# Patient Record
Sex: Female | Born: 1975
Health system: Southern US, Community
[De-identification: ages and names within clinical notes are randomized; demographics above are authoritative.]

## PROBLEM LIST (undated history)

## (undated) DIAGNOSIS — J302 Other seasonal allergic rhinitis: Secondary | ICD-10-CM

## (undated) DIAGNOSIS — Z87442 Personal history of urinary calculi: Secondary | ICD-10-CM

## (undated) HISTORY — DX: Other seasonal allergic rhinitis: J30.2

## (undated) HISTORY — PX: TUBAL LIGATION: SHX77

## (undated) HISTORY — DX: Personal history of urinary calculi: Z87.442

---

## 2015-02-17 ENCOUNTER — Encounter: Payer: Self-pay | Admitting: Obstetrics and Gynecology

## 2015-02-17 ENCOUNTER — Ambulatory Visit (INDEPENDENT_AMBULATORY_CARE_PROVIDER_SITE_OTHER): Payer: 59 | Admitting: Obstetrics and Gynecology

## 2015-02-17 VITALS — BP 110/60 | HR 84 | Resp 14 | Wt 119.0 lb

## 2015-02-17 DIAGNOSIS — N941 Dyspareunia: Secondary | ICD-10-CM

## 2015-02-17 DIAGNOSIS — Z01419 Encounter for gynecological examination (general) (routine) without abnormal findings: Secondary | ICD-10-CM

## 2015-02-17 DIAGNOSIS — Z0000 Encounter for general adult medical examination without abnormal findings: Secondary | ICD-10-CM

## 2015-02-17 DIAGNOSIS — IMO0002 Reserved for concepts with insufficient information to code with codable children: Secondary | ICD-10-CM

## 2015-02-17 DIAGNOSIS — N920 Excessive and frequent menstruation with regular cycle: Secondary | ICD-10-CM | POA: Diagnosis not present

## 2015-02-17 DIAGNOSIS — Z124 Encounter for screening for malignant neoplasm of cervix: Secondary | ICD-10-CM

## 2015-02-17 LAB — FERRITIN: Ferritin: 56 ng/mL (ref 10–291)

## 2015-02-17 LAB — CBC
HCT: 39.3 % (ref 36.0–46.0)
HEMOGLOBIN: 13.3 g/dL (ref 12.0–15.0)
MCH: 30.4 pg (ref 26.0–34.0)
MCHC: 33.8 g/dL (ref 30.0–36.0)
MCV: 89.9 fL (ref 78.0–100.0)
MPV: 9.7 fL (ref 8.6–12.4)
PLATELETS: 199 10*3/uL (ref 150–400)
RBC: 4.37 MIL/uL (ref 3.87–5.11)
RDW: 13.2 % (ref 11.5–15.5)
WBC: 9.7 10*3/uL (ref 4.0–10.5)

## 2015-02-17 LAB — COMPREHENSIVE METABOLIC PANEL
ALBUMIN: 4.3 g/dL (ref 3.6–5.1)
ALK PHOS: 55 U/L (ref 33–115)
ALT: 12 U/L (ref 6–29)
AST: 15 U/L (ref 10–30)
BILIRUBIN TOTAL: 0.6 mg/dL (ref 0.2–1.2)
BUN: 13 mg/dL (ref 7–25)
CALCIUM: 9 mg/dL (ref 8.6–10.2)
CO2: 24 mmol/L (ref 20–31)
Chloride: 104 mmol/L (ref 98–110)
Creat: 0.73 mg/dL (ref 0.50–1.10)
GLUCOSE: 76 mg/dL (ref 65–99)
POTASSIUM: 3.9 mmol/L (ref 3.5–5.3)
Sodium: 138 mmol/L (ref 135–146)
TOTAL PROTEIN: 6.6 g/dL (ref 6.1–8.1)

## 2015-02-17 LAB — LIPID PANEL
CHOLESTEROL: 169 mg/dL (ref 125–200)
HDL: 67 mg/dL (ref >=46)
LDL Cholesterol: 89 mg/dL (ref <130)
TRIGLYCERIDES: 66 mg/dL (ref <150)
Total CHOL/HDL Ratio: 2.5 Ratio (ref <=5.0)
VLDL: 13 mg/dL (ref <30)

## 2015-02-17 LAB — TSH: TSH: 1.16 u[IU]/mL (ref 0.350–4.500)

## 2015-02-17 NOTE — Progress Notes (Signed)
Patient ID: Sarah Chambers, female   DOB: Jan 19, 1976, 39 y.o.   MRN: [id number] 39 y.o. [id number] MarriedCaucasianF here for annual exam. Patient c/o cramping during intercourse. The pain after intercourse is intermittent. She can go a month without it and then can have it every time she has intercourse. This has been going on for years. Not cycle related. Cramping starts just after intercourse, with or without orgasm.  The pain is in her back, always severe, lasts for about 15 minutes, then resolves. Doesn't feel like she is having a spasm. More likely to occur if she is on top. Other than that no pain. Not sure if it happens without ejaculation. Often has a BM after the pain, then feels even better. Overall tolerable  During her cycle she can saturate a super tampon in up to 1 hour for up to 2 days.    Period Cycle (Days): 28 Period Duration (Days): 5-7 days  Period Pattern: Regular Menstrual Flow: Heavy Menstrual Control: Tampon, Maxi pad Dysmenorrhea: None  Patient's last menstrual period was 01/25/2015.          Sexually active: Yes.    The current method of family planning is tubal ligation.    Exercising: Yes.    cardio Smoker:  no  Health Maintenance: Pap:  Several years ago  History of abnormal Pap:  no MMG:  Never Colonoscopy:  Never BMD:   N/A TDaP:  Unsure, she will check her records Gardasil: N/A   reports that she has never smoked. She has never used smokeless tobacco. She reports that she drinks alcohol. She reports that she does not use illicit drugs.  History reviewed. No pertinent past medical history.  Past Surgical History  Procedure Laterality Date  . Cesarean section      1998, [id number]  . Tubal ligation      Current Outpatient Prescriptions  Medication Sig Dispense Refill  . cetirizine (ZYRTEC) 10 MG tablet Take 10 mg by mouth daily.     No current facility-administered medications for this visit.    History reviewed. No pertinent family  history.  Review of Systems  Constitutional: Negative.   HENT: Negative.   Eyes: Negative.   Respiratory: Negative.   Cardiovascular: Negative.   Gastrointestinal: Negative.   Endocrine: Negative.   Genitourinary: Positive for dyspareunia.  Musculoskeletal: Negative.   Skin: Negative.   Allergic/Immunologic: Negative.   Neurological: Negative.   Psychiatric/Behavioral: Negative.     Exam:   BP 110/60 mmHg  Pulse 84  Resp 14  Wt 119 lb (53.978 kg)  LMP 01/25/2015  Weight change: @WEIGHTCHANGE @ Height:      Ht Readings from Last 3 Encounters:  No data found for Ht    General appearance: alert, cooperative and appears stated age Head: Normocephalic, without obvious abnormality, atraumatic Neck: no adenopathy, supple, symmetrical, trachea midline and thyroid normal to inspection and palpation Lungs: clear to auscultation bilaterally Breasts: normal appearance, no masses or tenderness Heart: regular rate and rhythm Abdomen: soft, non-tender; bowel sounds normal; no masses,  no organomegaly Extremities: extremities normal, atraumatic, no cyanosis or edema Skin: Skin color, texture, turgor normal. No rashes or lesions Lymph nodes: Cervical, supraclavicular, and axillary nodes normal. No abnormal inguinal nodes palpated Neurologic: Grossly normal   Pelvic: External genitalia:  no lesions              Urethra:  normal appearing urethra with no masses, tenderness or lesions  Bartholins and Skenes: normal                 Vagina: normal appearing vagina with normal color and discharge, no lesions              Cervix: no lesions               Bimanual Exam:  Uterus:  Retroverted, not tender, normal sized, decreased mobility.              Adnexa: no mass, fullness, tenderness               Rectovaginal: Confirms               Anus:  normal sphincter tone, no lesions  Chaperone was present for exam.  A:  Well Woman with normal exam  Pain after  intercourse  Menorrhagia  P:   Pap with hpv  Mammogram  Discussed breast self exam  Discussed calcium and vit D  CBC, ferritin, TSH, Cholesterol/HDL, vit D  If she is anemic would recommend further evaluation  Discussed tracking when she has the pain after intercourse. Does it only happen with internal ejaculation and orgasm. Discussed trying condoms, avoiding positions that are move likely to cause the pain. Consider prophylacticly taking motrin. Track bowel movements with the  Pain. Discussed the option of ultrasound, with the intermittent nature and non-tender pelvic exam low yield. Discussed laparoscopy if pain became intolerable.

## 2015-02-17 NOTE — Patient Instructions (Signed)

## 2015-02-18 LAB — VITAMIN D 25 HYDROXY (VIT D DEFICIENCY, FRACTURES): VIT D 25 HYDROXY: 35 ng/mL (ref 30–100)

## 2015-02-19 LAB — IPS PAP TEST WITH HPV

## 2015-02-23 ENCOUNTER — Telehealth: Payer: Self-pay | Admitting: *Deleted

## 2015-02-23 NOTE — Telephone Encounter (Signed)
Return call

## 2015-02-23 NOTE — Telephone Encounter (Signed)
-----   Message from Romualdo Bolk, MD sent at 02/23/2015 10:47 AM EDT ----- Please advise the patient of normal results.

## 2015-02-23 NOTE — Telephone Encounter (Signed)
Spoke with patient and gave lab results -eh 

## 2015-02-23 NOTE — Telephone Encounter (Signed)
LMTC in regards to lab results -eh 

## 2015-02-25 ENCOUNTER — Encounter: Payer: Self-pay | Admitting: Primary Care

## 2015-02-25 ENCOUNTER — Ambulatory Visit (INDEPENDENT_AMBULATORY_CARE_PROVIDER_SITE_OTHER): Payer: 59 | Admitting: Primary Care

## 2015-02-25 VITALS — BP 112/64 | HR 75 | Temp 97.7°F | Ht 62.0 in | Wt 119.1 lb

## 2015-02-25 DIAGNOSIS — J302 Other seasonal allergic rhinitis: Secondary | ICD-10-CM | POA: Diagnosis not present

## 2015-02-25 MED ORDER — FLUTICASONE PROPIONATE 50 MCG/ACT NA SUSP: 2.0000 | Freq: Every day | NASAL | Status: DC

## 2015-02-25 NOTE — Progress Notes (Signed)
   Subjective:    Patient ID: Sarah Chambers, female    DOB: January 07, 1976, 39 y.o.   MRN: [id number]  HPI  Sarah Chambers is a 39 year old female who presents today to establish care and discuss the problems mentioned below. She was recently evaluated at [facility] for annual exam, will review records. She had CBC, CMP, TSH, Lipid Panel, Vitamin D, and Pap. All tests were normal.   1) Kidney Stone: History of 3 separate incidences, last episode 4 years ago.   2) Seasonal Allergies: Present for years. Symptoms are worse in Fall and Spring. She will get postnasal drip and nasal congestion. She will take Zyrtec OTC daily. She was once managed on Fluticasone but has not had recently.  Review of Systems  Constitutional: Negative for unexpected weight change.  HENT: Positive for congestion and postnasal drip. Negative for sinus pressure.   Respiratory: Positive for cough. Negative for shortness of breath.   Cardiovascular: Negative for chest pain.  Gastrointestinal: Negative for diarrhea and constipation.  Genitourinary: Negative for difficulty urinating.       Regular periods  Musculoskeletal: Negative for myalgias and arthralgias.  Skin: Negative for rash.  Neurological: Negative for dizziness, numbness and headaches.  Psychiatric/Behavioral:       Denies concerns for anxiety or depression       Past Medical History  Diagnosis Date  . Seasonal allergies   . History of kidney stones     Social History   Social History  . Marital Status: Married    Spouse Name: N/A  . Number of Children: N/A  . Years of Education: N/A   Occupational History  . Not on file.   Social History Main Topics  . Smoking status: Never Smoker   . Smokeless tobacco: Not on file  . Alcohol Use: No  . Drug Use: Not on file  . Sexual Activity: Not on file   Other Topics Concern  . Not on file   Social History Narrative   Married.   3 children.   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   Highest level of education [employer].   Enjoys exercising, reading     Past Surgical History  Procedure Laterality Date  . Cesarean section  1998, 2000, 2007    No family history on file.  Allergies  Allergen Reactions  . Amoxicillin Hives    No current outpatient prescriptions on file prior to visit.   No current facility-administered medications on file prior to visit.    BP 112/64 mmHg  Pulse 75  Temp(Src) 97.7 F (36.5 C) (Oral)  Ht  (1.575 m)  Wt 119 lb 1.9 oz (54.032 kg)  BMI 21.78 kg/m2  SpO2 99%  LMP 02/23/2015    Objective:   Physical Exam  Constitutional: She is oriented to person, place, and time. She appears well-nourished.  Cardiovascular: Normal rate and regular rhythm.   Pulmonary/Chest: Effort normal and breath sounds normal.  Neurological: She is alert and oriented to person, place, and time.  Skin: Skin is warm and dry.  Psychiatric: She has a normal mood and affect.          Assessment & Plan:

## 2015-02-25 NOTE — Assessment & Plan Note (Signed)
Present for years. Symptoms of postnasal drip and nasal congestion, worse during seasonal changes. Taking daily zyrtec, uses flonase PRN.

## 2015-02-25 NOTE — Progress Notes (Signed)
Pre visit review using our clinic review tool, if applicable. No additional management support is needed unless otherwise documented below in the visit note. 

## 2015-02-25 NOTE — Patient Instructions (Signed)
Start taking Vitamin D 1000 units daily. This may be purchased over the counter.  It was a pleasure to meet you today! Please don't hesitate to call me with any questions. Welcome to [facility]!

## 2015-03-05 ENCOUNTER — Encounter: Payer: Self-pay | Admitting: Primary Care

## 2015-04-16 ENCOUNTER — Ambulatory Visit (INDEPENDENT_AMBULATORY_CARE_PROVIDER_SITE_OTHER): Payer: 59 | Admitting: Family Medicine

## 2015-04-16 ENCOUNTER — Encounter: Payer: Self-pay | Admitting: Family Medicine

## 2015-04-16 VITALS — BP 94/60 | HR 89 | Temp 98.3°F | Ht 62.0 in | Wt 120.5 lb

## 2015-04-16 DIAGNOSIS — J209 Acute bronchitis, unspecified: Secondary | ICD-10-CM | POA: Diagnosis not present

## 2015-04-16 DIAGNOSIS — J302 Other seasonal allergic rhinitis: Secondary | ICD-10-CM

## 2015-04-16 MED ORDER — HYDROCODONE-CHLORPHENIRAMINE 5-4 MG/5ML PO SOLN: ORAL | Status: DC

## 2015-04-16 MED ORDER — AZITHROMYCIN 250 MG PO TABS: ORAL_TABLET | ORAL | Status: DC

## 2015-04-16 NOTE — Assessment & Plan Note (Signed)
Ongoing x 3 weeks.. Concerning for bacterial infection.  MAy also be ue to allergies. IF not improving as expected consider trial of singulair.

## 2015-04-16 NOTE — Progress Notes (Signed)
Pre visit review using our clinic review tool, if applicable. No additional management support is needed unless otherwise documented below in the visit note. 

## 2015-04-16 NOTE — Progress Notes (Signed)
   Subjective:    Patient ID: Sarah Chambers, female    DOB: [birth year], 39 y.o.   MRN: [id number]  Cough This is a new problem. The current episode started 1 to 4 weeks ago (3 weeks). The problem has been waxing and waning. The cough is productive of sputum. Associated symptoms include ear congestion, headaches, nasal congestion, postnasal drip and wheezing. Pertinent negatives include no ear pain, fever, myalgias or shortness of breath. Associated symptoms comments: Sinus pressure. The symptoms are aggravated by lying down. Risk factors: non smoker. She has tried prescription cough suppressant (zyrtec, mucinex D off and on for 2-3 weeks.) for the symptoms. The treatment provided no relief. Her past medical history is significant for environmental allergies. There is no history of asthma, COPD or emphysema. spring and fall allergies   Also using flonase. Needs refill of cough suppressant from last year.  Lots of sick contacts.  Social History /Family History/Past Medical History reviewed and updated if needed.   Review of Systems  Constitutional: Negative for fever.  HENT: Positive for postnasal drip. Negative for ear pain.   Respiratory: Positive for cough and wheezing. Negative for shortness of breath.   Musculoskeletal: Negative for myalgias.  Allergic/Immunologic: Positive for environmental allergies.  Neurological: Positive for headaches.       Objective:   Physical Exam  Constitutional: Vital signs are normal. She appears well-developed and well-nourished. She is cooperative.  Non-toxic appearance. She does not appear ill. No distress.  HENT:  Head: Normocephalic.  Right Ear: Hearing, tympanic membrane, external ear and ear canal normal. Tympanic membrane is not erythematous, not retracted and not bulging.  Left Ear: Hearing, tympanic membrane, external ear and ear canal normal. Tympanic membrane is not erythematous, not retracted and not bulging.  Nose: Mucosal edema and  rhinorrhea present. Right sinus exhibits no maxillary sinus tenderness and no frontal sinus tenderness. Left sinus exhibits no maxillary sinus tenderness and no frontal sinus tenderness.  Mouth/Throat: Uvula is midline, oropharynx is clear and moist and mucous membranes are normal.  Eyes: Conjunctivae, EOM and lids are normal. Pupils are equal, round, and reactive to light. Lids are everted and swept, no foreign bodies found.  Neck: Trachea normal and normal range of motion. Neck supple. Carotid bruit is not present. No thyroid mass and no thyromegaly present.  Cardiovascular: Normal rate, regular rhythm, S1 normal, S2 normal, normal heart sounds, intact distal pulses and normal pulses.  Exam reveals no gallop and no friction rub.   No murmur heard. Pulmonary/Chest: Effort normal and breath sounds normal. No tachypnea. No respiratory distress. She has no decreased breath sounds. She has no wheezes. She has no rhonchi. She has no rales.  Neurological: She is alert.  Skin: Skin is warm, dry and intact. No rash noted.  Psychiatric: Her speech is normal and behavior is normal. Judgment normal. Her mood appears not anxious. Cognition and memory are normal. She does not exhibit a depressed mood.          Assessment & Plan:

## 2015-04-16 NOTE — Patient Instructions (Addendum)
Continue mucinex D and zyrtec daily.  Start nasal saline spray 2-3 times daily.  Complete course of  Azithromycin.  Call if not improving as expected to reconsider allergies as cause for a trial of Singulair.  Go to ER if shortness of breath.

## 2015-08-24 ENCOUNTER — Encounter: Payer: Self-pay | Admitting: Primary Care

## 2015-08-24 ENCOUNTER — Ambulatory Visit (INDEPENDENT_AMBULATORY_CARE_PROVIDER_SITE_OTHER): Payer: 59 | Admitting: Primary Care

## 2015-08-24 VITALS — BP 116/70 | HR 85 | Temp 98.0°F | Ht 62.0 in | Wt 118.1 lb

## 2015-08-24 DIAGNOSIS — J101 Influenza due to other identified influenza virus with other respiratory manifestations: Secondary | ICD-10-CM

## 2015-08-24 LAB — POC INFLUENZA A&B (BINAX/QUICKVUE)
Influenza A, POC: NEGATIVE
Influenza B, POC: POSITIVE — AB

## 2015-08-24 MED ORDER — HYDROCODONE-HOMATROPINE 5-1.5 MG/5ML PO SYRP: 5.0000 mL | ORAL_SOLUTION | Freq: Every evening | ORAL | Status: DC | PRN

## 2015-08-24 MED ORDER — OSELTAMIVIR PHOSPHATE 75 MG PO CAPS: 75.0000 mg | ORAL_CAPSULE | Freq: Two times a day (BID) | ORAL | Status: DC

## 2015-08-24 MED ORDER — BENZONATATE 200 MG PO CAPS: 200.0000 mg | ORAL_CAPSULE | Freq: Three times a day (TID) | ORAL | Status: DC | PRN

## 2015-08-24 NOTE — Progress Notes (Signed)
Subjective:    Patient ID: Sarah Chambers, female    DOB: 11-Feb-1976, 40 y.o.   MRN: [id number]  HPI  Ms. Loretha StaplerVanNess is a 40 year old female who presents today with a chief complaint of flu like symptoms. She reports fevers, nasal congestion, chills, body aches, cough, headache, ear pressure. Her symptoms began with a cough on Saturday last week, woke up "Sunday morning with the remaining symptoms. She's tried taken Mucinex, ibuprofen, Flonase, saline nasal spray without improvement. Her cough is mostly dry. 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  Review of Systems  Constitutional: Positive for fever and chills.  HENT: Positive for congestion.        Ear fullness  Respiratory: Positive for cough.   Musculoskeletal: Positive for myalgias.  Neurological: Positive for headaches.       Past Medical History  Diagnosis Date  . Seasonal allergies   . History of kidney stones     Social History   Social History  . Marital Status: Married    Spouse Name: N/A  . Number of Children: N/A  . Years of Education: N/A   Occupational History  . Not on file.   Social History Main Topics  . Smoking status: Never Smoker   . Smokeless tobacco: Never Used  . Alcohol Use: No     Comment: socailly   . Drug Use: No  . Sexual Activity:    Partners: Male   Other Topics Concern  . Not on file   Social History Narrative   ** Merged History Encounter **       Married. 3 children. 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 Highest level of education [employer]. Enjoys exercising, reading     Past Surgical History  Procedure Laterality Date  . Cesarean section      19" 98, 2000,2007  . Tubal ligation    . Cesarean section  1998, 2000, 2007    No family history on file.  Allergies  Allergen Reactions  . Amoxicillin Hives    Current Outpatient Prescriptions on File Prior to Visit  Medication Sig Dispense Refill  . cetirizine (ZYRTEC)  10 MG tablet Take 10 mg by mouth daily.    . fluticasone (FLONASE) 50 MCG/ACT nasal spray Place 2 sprays into both nostrils daily. 16 g 5   No current facility-administered medications on file prior to visit.    BP 116/70 mmHg  Pulse 85  Temp(Src) 98 F (36.7 C) (Oral)  Ht 5\' 2"  (1.575 m)  Wt 118 lb 1.9 oz (53.579 kg)  BMI 21.60 kg/m2  SpO2 99%  LMP 08/02/2015    Objective:   Physical Exam  Constitutional: She appears well-nourished. She appears ill.  HENT:  Right Ear: Tympanic membrane and ear canal normal.  Left Ear: Tympanic membrane and ear canal normal.  Nose: Right sinus exhibits no maxillary sinus tenderness and no frontal sinus tenderness. Left sinus exhibits no maxillary sinus tenderness and no frontal sinus tenderness.  Mouth/Throat: Posterior oropharyngeal erythema present. No oropharyngeal exudate or posterior oropharyngeal edema.  Eyes: Conjunctivae are normal.  Neck: Neck supple.  Cardiovascular: Regular rhythm.   Sinus tachycardia  Pulmonary/Chest: Effort normal. She has no decreased breath sounds. She has wheezes in the left lower field. She has no rhonchi.  Lymphadenopathy:    She has cervical adenopathy.  Skin: Skin is warm and dry.          Assessment &  Plan:  Flu-like Symptoms:  Sudden onset on Sunday with fevers, chills, body aches, fatigue. Cough began Saturday. Exam with clear lungs, sinus tachycardia, appears ill and fatigued. Rapid Flu: Positive for influenza B. Start Tamilfu as she is barley with in the 48 hour window. RX for Tessalon pearls for day cough, Hycodan for night cough. Continue Tylenol/ibuprofen for fevers and body aches. Increase fluids and rest.

## 2015-08-24 NOTE — Addendum Note (Signed)
Addended by: Tawnya CrookSAMBATH, Rickayla Wieland on: 08/24/2015 04:03 PM   Modules accepted: Orders

## 2015-08-24 NOTE — Patient Instructions (Signed)
You may take Benzonatate capsules for cough. Take 1 capsule by mouth three times daily as needed for cough.  You may take the Hycodan cough suppressant at bedtime as needed for cough and rest. Caution this medication contains codeine and will make you feel drowsy.  Start Tamiflu to help shorten the course of the flu and for symptom improvement. Take 1 capsule by mouth twice daily for 5 days.   Increase consumption of fluids and rest.  It was a pleasure to see you today!  Influenza, Adult Influenza ("the flu") is a viral infection of the respiratory tract. It occurs more often in winter months because people spend more time in close contact with one another. Influenza can make you feel very sick. Influenza easily spreads from person to person (contagious). CAUSES  Influenza is caused by a virus that infects the respiratory tract. You can catch the virus by breathing in droplets from an infected person's cough or sneeze. You can also catch the virus by touching something that was recently contaminated with the virus and then touching your mouth, nose, or eyes. RISKS AND COMPLICATIONS You may be at risk for a more severe case of influenza if you smoke cigarettes, have diabetes, have chronic heart disease (such as heart failure) or lung disease (such as asthma), or if you have a weakened immune system. Elderly people and pregnant women are also at risk for more serious infections. The most common problem of influenza is a lung infection (pneumonia). Sometimes, this problem can require emergency medical care and may be life threatening. SIGNS AND SYMPTOMS  Symptoms typically last 4 to 10 days and may include:  Fever.  Chills.  Headache, body aches, and muscle aches.  Sore throat.  Chest discomfort and cough.  Poor appetite.  Weakness or feeling tired.  Dizziness.  Nausea or vomiting. DIAGNOSIS  Diagnosis of influenza is often made based on your history and a physical exam. A nose or  throat swab test can be done to confirm the diagnosis. TREATMENT  In mild cases, influenza goes away on its own. Treatment is directed at relieving symptoms. For more severe cases, your health care provider may prescribe antiviral medicines to shorten the sickness. Antibiotic medicines are not effective because the infection is caused by a virus, not by bacteria. HOME CARE INSTRUCTIONS  Take medicines only as directed by your health care provider.  Use a cool mist humidifier to make breathing easier.  Get plenty of rest until your temperature returns to normal. This usually takes 3 to 4 days.  Drink enough fluid to keep your urine clear or pale yellow.  Cover yourmouth and nosewhen coughing or sneezing,and wash your handswellto prevent thevirusfrom spreading.  Stay homefromwork orschool untilthe fever is gonefor at least 861full day. PREVENTION  An annual influenza vaccination (flu shot) is the best way to avoid getting influenza. An annual flu shot is now routinely recommended for all adults in the [place] SEEK MEDICAL CARE IF:  You experiencechest pain, yourcough worsens,or you producemore mucus.  Youhave nausea,vomiting, ordiarrhea.  Your fever returns or gets worse. SEEK IMMEDIATE MEDICAL CARE IF:  You havetrouble breathing, you become short of breath,or your skin ornails becomebluish.  You have severe painor stiffnessin the neck.  You develop a sudden headache, or pain in the face or ear.  You have nausea or vomiting that you cannot control. MAKE SURE YOU:   Understand these instructions.  Will watch your condition.  Will get help right away if you are not  doing well or get worse.   This information is not intended to replace advice given to you by your health care provider. Make sure you discuss any questions you have with your health care provider.   Document Released: 05/19/2000 Document Revised: 06/12/2014 Document Reviewed:  08/21/2011 Elsevier Interactive Patient Education [employer]

## 2015-08-24 NOTE — Progress Notes (Signed)
Pre visit review using our clinic review tool, if applicable. No additional management support is needed unless otherwise documented below in the visit note. 

## 2015-09-23 ENCOUNTER — Encounter: Payer: Self-pay | Admitting: Family Medicine

## 2015-09-23 ENCOUNTER — Ambulatory Visit (INDEPENDENT_AMBULATORY_CARE_PROVIDER_SITE_OTHER): Payer: 59 | Admitting: Family Medicine

## 2015-09-23 VITALS — BP 104/62 | HR 82 | Temp 98.1°F | Wt 118.2 lb

## 2015-09-23 DIAGNOSIS — J209 Acute bronchitis, unspecified: Secondary | ICD-10-CM

## 2015-09-23 DIAGNOSIS — R05 Cough: Secondary | ICD-10-CM | POA: Insufficient documentation

## 2015-09-23 DIAGNOSIS — R051 Acute cough: Secondary | ICD-10-CM | POA: Insufficient documentation

## 2015-09-23 DIAGNOSIS — R059 Cough, unspecified: Secondary | ICD-10-CM | POA: Insufficient documentation

## 2015-09-23 MED ORDER — HYDROCOD POLST-CPM POLST ER 10-8 MG/5ML PO SUER: 5.0000 mL | Freq: Two times a day (BID) | ORAL | Status: DC | PRN

## 2015-09-23 MED ORDER — PREDNISONE 50 MG PO TABS: ORAL_TABLET | ORAL | Status: DC

## 2015-09-23 MED ORDER — DOXYCYCLINE HYCLATE 100 MG PO TABS: 100.0000 mg | ORAL_TABLET | Freq: Two times a day (BID) | ORAL | Status: DC

## 2015-09-23 NOTE — Progress Notes (Signed)
Subjective:  Patient ID: Sarah Chambers, female    DOB: 13-Jan-1976  Age: 40 y.o. MRN: [id number]  CC: Cough  HPI:  40 year old female presents to clinic today for an acute visit with complaints of cough.  Patient states that she has had a severe cough for the past month. She states that it started after she was diagnosed with influenza. Cough is severe and unrelenting. She's been taking over-the-counter antihistamine as well as Flonase with no improvement. She reports that she's had some wheezing. She used her son's albuterol inhaler with no improvement. Additionally she reports associated congestion. She states her symptoms are severe and she would like some relief. No associated fevers or chills. No known exacerbating factors.  Social Hx   Social History   Social History  . Marital Status: Married    Spouse Name: N/A  . Number of Children: N/A  . Years of Education: N/A   Social History Main Topics  . Smoking status: Never Smoker   . Smokeless tobacco: Never Used  . Alcohol Use: No     Comment: socailly   . Drug Use: No  . Sexual Activity:    Partners: Male   Other Topics Concern  . None   Social History Narrative   ** Merged History Encounter **       Married. 3 children. 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 Enjoys exercising, reading    Review of Systems  Constitutional: Negative for fever.  HENT: Positive for congestion.   Respiratory: Positive for cough and wheezing.    Objective:  BP 104/62 mmHg  Pulse 82  Temp(Src) 98.1 F (36.7 C) (Oral)  Wt 118 lb 4 oz (53.638 kg)  SpO2 98%  LMP 08/02/2015  BP/Weight 09/23/2015 08/24/2015 04/16/2015  Systolic BP 104 116 94  Diastolic BP 62 70 60  Wt. (Lbs) 118.25 118.12 120.5  BMI 21.62 21.6 22.03   Physical Exam  Constitutional: She is oriented to person, place, and time. She appears well-developed. No distress.  HENT:  Mouth/Throat: Oropharynx is  clear and moist.  Cardiovascular: Normal rate and regular rhythm.   Pulmonary/Chest: Effort normal.  Coarse breath sounds throughout. No wheezing noted.  Neurological: She is alert and oriented to person, place, and time.  Psychiatric: She has a normal mood and affect.  Vitals reviewed.  Lab Results  Component Value Date   WBC 9.7 02/17/2015   HGB 13.3 02/17/2015   HCT 39.3 02/17/2015   PLT 199 02/17/2015   GLUCOSE 76 02/17/2015   CHOL 169 02/17/2015   TRIG 66 02/17/2015   HDL 67 02/17/2015   LDLCALC 89 02/17/2015   ALT 12 02/17/2015   AST 15 02/17/2015   NA 138 02/17/2015   K 3.9 02/17/2015   CL 104 02/17/2015   CREATININE 0.73 02/17/2015   BUN 13 02/17/2015   CO2 24 02/17/2015   TSH 1.160 02/17/2015   Assessment & Plan:   Problem List Items Addressed This Visit    Acute bronchitis - Primary    No acute problem. History consistent with acute bronchitis. Patient was concerned prednisone and Tussionex. Given duration of illness, obtaining chest x-ray and starting patient on empiric doxycycline.      Relevant Orders   DG Chest 2 View      Meds ordered this encounter  Medications  . chlorpheniramine-HYDROcodone (TUSSIONEX PENNKINETIC ER) 10-8 MG/5ML SUER    Sig: Take 5 mLs by mouth every 12 (twelve)  hours as needed.    Dispense:  115 mL    Refill:  0  . doxycycline (VIBRA-TABS) 100 MG tablet    Sig: Take 1 tablet (100 mg total) by mouth 2 (two) times daily.    Dispense:  14 tablet    Refill:  0  . predniSONE (DELTASONE) 50 MG tablet    Sig: 1 tablet daily x 5 days.    Dispense:  5 tablet    Refill:  0    Follow-up: PRN  Everlene OtherJayce Teyonna Plaisted DO [facility] [place]

## 2015-09-23 NOTE — Patient Instructions (Signed)

## 2015-09-23 NOTE — Assessment & Plan Note (Signed)
No acute problem. History consistent with acute bronchitis. Patient was concerned prednisone and Tussionex. Given duration of illness, obtaining chest x-ray and starting patient on empiric doxycycline.

## 2015-09-27 ENCOUNTER — Ambulatory Visit (HOSPITAL_COMMUNITY)
Admission: RE | Admit: 2015-09-27 | Discharge: 2015-09-27 | Disposition: A | Discharge status: Home / Self Care | Payer: 59 | Source: Ambulatory Visit | Attending: Family Medicine | Admitting: Family Medicine

## 2015-09-27 DIAGNOSIS — R05 Cough: Secondary | ICD-10-CM | POA: Diagnosis not present

## 2015-09-27 DIAGNOSIS — J209 Acute bronchitis, unspecified: Secondary | ICD-10-CM | POA: Diagnosis not present

## 2015-09-27 DIAGNOSIS — R0602 Shortness of breath: Secondary | ICD-10-CM | POA: Diagnosis not present

## 2015-11-08 ENCOUNTER — Encounter: Payer: Self-pay | Admitting: Nurse Practitioner

## 2015-11-08 ENCOUNTER — Ambulatory Visit (INDEPENDENT_AMBULATORY_CARE_PROVIDER_SITE_OTHER): Payer: 59 | Admitting: Nurse Practitioner

## 2015-11-08 VITALS — BP 102/60 | HR 72 | Resp 13 | Wt 119.0 lb

## 2015-11-08 DIAGNOSIS — N760 Acute vaginitis: Secondary | ICD-10-CM | POA: Diagnosis not present

## 2015-11-08 NOTE — Patient Instructions (Signed)

## 2015-11-08 NOTE — Progress Notes (Signed)
40 y.o. Married Caucasian female [id number] here with complaint of vaginal symptoms of odor, burning, and increase discharge. Describes discharge as white thin brown. Onset of symptoms 2 weeks days ago. Denies new personal products or vaginal dryness. No STD concerns. Urinary symptoms none.  LMP 10/25/15. Contraception is BTL.   O:  Healthy female WDWN Affect: normal, orientation x 3  Exam: Abdomen: soft and non tender Lymph node: no enlargement or tenderness Pelvic exam: External genital: normal female BUS: negative Vagina: brown thin discharge noted.  Affirm taken. Retained tampon is removed Cervix: normal, non tender, no CMT   A: Vaginitis - R/O BV   P: Discussed findings of vaginitis and etiology. Discussed Aveeno or baking soda sitz bath for comfort. Avoid moist clothes or pads for extended period of time. If working out in gym clothes or swim suits for long periods of time change underwear or bottoms of swimsuit if possible. Olive Oil/Coconut Oil use for skin protection prior to activity can be used to external skin.  Rx: Do not treat with Metrogel cream - use Flagyl PO if BV  Follow with Affirm  RV prn

## 2015-11-09 LAB — WET PREP BY MOLECULAR PROBE
Candida species: NEGATIVE
GARDNERELLA VAGINALIS: NEGATIVE
Trichomonas vaginosis: NEGATIVE

## 2015-11-10 NOTE — Progress Notes (Signed)
Encounter reviewed Yesmin Mutch, MD   

## 2015-12-29 MED FILL — FLUTICASONE PROP 50 MCG SPR: 50 | 30 days supply | Qty: 16 | Fill #1

## 2016-03-30 MED FILL — CEFUROXIME AXETIL 250 MG TA: 250 | 10 days supply | Qty: 40 | Fill #0

## 2016-05-09 ENCOUNTER — Encounter: Payer: Self-pay | Admitting: Primary Care

## 2016-05-09 ENCOUNTER — Ambulatory Visit (INDEPENDENT_AMBULATORY_CARE_PROVIDER_SITE_OTHER): Payer: 59 | Admitting: Primary Care

## 2016-05-09 ENCOUNTER — Ambulatory Visit (INDEPENDENT_AMBULATORY_CARE_PROVIDER_SITE_OTHER)
Admission: RE | Admit: 2016-05-09 | Discharge: 2016-05-09 | Disposition: A | Discharge status: Home / Self Care | Payer: 59 | Source: Ambulatory Visit | Attending: Primary Care | Admitting: Primary Care

## 2016-05-09 VITALS — BP 130/84 | HR 78 | Temp 98.2°F | Ht 62.0 in | Wt 121.4 lb

## 2016-05-09 DIAGNOSIS — R05 Cough: Secondary | ICD-10-CM

## 2016-05-09 DIAGNOSIS — R059 Cough, unspecified: Secondary | ICD-10-CM

## 2016-05-09 MED ORDER — DOXYCYCLINE HYCLATE 100 MG PO TABS: 100.0000 mg | ORAL_TABLET | Freq: Two times a day (BID) | ORAL | 0 refills | Status: DC

## 2016-05-09 NOTE — Progress Notes (Signed)
Subjective:    Patient ID: Kathaleen BuryGinger D Bretado, female    DOB: [birth year], 40 y.o.   MRN: [id number]  HPI  Ms. Loretha StaplerVanNess is a 40 year old female who presents today with a chief complaint of cough and ear pain. Her pain is located to the left ear. She also reports headache, sore throat, chest congestion, night sweats. Her symptoms have been present for the past 1 month.  She was treated was treated through an e-visit for a "bacterial infection" and was treated with a 10 day course of antibiotics (thinks it may have been cephalexin) with temporary improvement to her symptoms. Intermittently since then she's noticed left ear pain, increased cough, and congestion. She's taken Mucinex daily, Flonase, and Zyrtec without any improvement. She started feeling worse several days ago.  Review of Systems  Constitutional: Positive for diaphoresis and fatigue. Negative for chills and fever.  HENT: Positive for congestion, ear pain, postnasal drip and sinus pressure.   Respiratory: Positive for cough. Negative for shortness of breath.        Past Medical History:  Diagnosis Date  . History of kidney stones   . Seasonal allergies      Social History   Social History  . Marital status: Married    Spouse name: N/A  . Number of children: N/A  . Years of education: N/A   Occupational History  . Not on file.   Social History Main Topics  . Smoking status: Never Smoker  . Smokeless tobacco: Never Used  . Alcohol use No     Comment: socailly   . Drug use: No  . Sexual activity: Yes    Partners: Male   Other Topics Concern  . Not on file   Social History Narrative   ** Merged History Encounter **       Married. 3 children. 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 Highest level of education [employer]. Enjoys exercising, reading     Past Surgical History:  Procedure Laterality Date  . CESAREAN SECTION     1998, [id number],2007  . CESAREAN SECTION  1998, 2000, 2007  . TUBAL  LIGATION      No family history on file.  Allergies  Allergen Reactions  . Amoxicillin Hives    Current Outpatient Prescriptions on File Prior to Visit  Medication Sig Dispense Refill  . cetirizine (ZYRTEC) 10 MG tablet Take 10 mg by mouth daily.     No current facility-administered medications on file prior to visit.     BP 130/84   Pulse 78   Temp 98.2 F (36.8 C) (Oral)   Ht 5\' 2"  (1.575 m)   Wt 121 lb 6.4 oz (55.1 kg)   LMP 04/18/2016   SpO2 99%   BMI 22.20 kg/m    Objective:   Physical Exam  Constitutional: She appears well-nourished. She does not appear ill.  HENT:  Right Ear: Tympanic membrane and ear canal normal.  Left Ear: Tympanic membrane and ear canal normal.  Nose: Right sinus exhibits no maxillary sinus tenderness and no frontal sinus tenderness. Left sinus exhibits no maxillary sinus tenderness and no frontal sinus tenderness.  Mouth/Throat: Oropharynx is clear and moist.  Eyes: Conjunctivae are normal.  Neck: Neck supple.  Cardiovascular: Normal rate and regular rhythm.   Pulmonary/Chest: Effort normal. She has no decreased breath sounds. She has no wheezes. She has rhonchi in the right lower field and the left lower field. She has no rales.  Lymphadenopathy:    She has no cervical adenopathy.  Skin: Skin is warm and dry.          Assessment & Plan:  Acute Bronchitis:  Cough, congestion, ear pain, fatigue x 1 month. Temporary improvement with Cephalexin from e-visit, now worse. Exam today with moderate rhonchi to bases, does appear ill. Given duration of symptoms, examination, starting to feel worse, will cover for atypical bacterial involvement. Rx for Doxycycline sent to pharmacy. Continue albuterol inhaler, Mucinex, Zyrtec. Fluids, rest, follow up PRN.  Morrie Sheldonlark,Creston Klas Kendal, NP

## 2016-05-09 NOTE — Progress Notes (Signed)
nasalPre visit review using our clinic review tool, if applicable. No additional management support is needed unless otherwise documented below in the visit note.

## 2016-05-09 NOTE — Patient Instructions (Signed)
Start Doxycycline antibiotic. Take 1 tablet by mouth twice daily for 10 days.  Complete xray(s) prior to leaving today. I will notify you of your results once received.  Continue Mucinex and Zyrtec. Try taking either Delsym or Robitussin for cough.  Ensure you are staying hydrated and rest.  It was a pleasure to see you today!

## 2016-05-10 ENCOUNTER — Other Ambulatory Visit: Payer: Self-pay | Admitting: *Deleted

## 2016-05-10 ENCOUNTER — Other Ambulatory Visit: Payer: Self-pay | Admitting: Primary Care

## 2016-05-10 DIAGNOSIS — J45909 Unspecified asthma, uncomplicated: Secondary | ICD-10-CM

## 2016-05-10 MED ORDER — PREDNISONE 20 MG PO TABS: ORAL_TABLET | ORAL | 0 refills | Status: DC

## 2016-08-09 ENCOUNTER — Encounter: Payer: Self-pay | Admitting: Adult Health

## 2016-08-09 ENCOUNTER — Ambulatory Visit (INDEPENDENT_AMBULATORY_CARE_PROVIDER_SITE_OTHER): Payer: 59 | Admitting: Adult Health

## 2016-08-09 VITALS — BP 102/68 | HR 78 | Temp 98.2°F | Wt 123.6 lb

## 2016-08-09 DIAGNOSIS — R109 Unspecified abdominal pain: Secondary | ICD-10-CM

## 2016-08-09 LAB — POC URINALSYSI DIPSTICK (AUTOMATED)
BILIRUBIN UA: NEGATIVE
Blood, UA: NEGATIVE
GLUCOSE UA: NEGATIVE
KETONES UA: NEGATIVE
Nitrite, UA: NEGATIVE
PROTEIN UA: NEGATIVE
Spec Grav, UA: 1.015
Urobilinogen, UA: 0.2
pH, UA: 6

## 2016-08-09 MED ORDER — NITROFURANTOIN MONOHYD MACRO 100 MG PO CAPS: 100.0000 mg | ORAL_CAPSULE | Freq: Two times a day (BID) | ORAL | 0 refills | Status: DC

## 2016-08-09 MED FILL — NITROFURANTOIN MONO-MCR 100: 100 | 5 days supply | Qty: 10 | Fill #0

## 2016-08-09 NOTE — Progress Notes (Signed)
Pre visit review using our clinic review tool, if applicable. No additional management support is needed unless otherwise documented below in the visit note. 

## 2016-08-09 NOTE — Patient Instructions (Signed)
WE NOW OFFER   [facility] FAST TRACK!!!  SAME DAY Appointments for ACUTE CARE  Such as: Sprains, Injuries, cuts, abrasions, rashes, muscle pain, joint pain, back pain Colds, flu, sore throats, headache, allergies, cough, fever  Ear pain, sinus and eye infections Abdominal pain, nausea, vomiting, diarrhea, upset stomach Animal/insect bites  3 Easy Ways to Schedule: Walk-In Scheduling Call in scheduling Mychart Sign-up: [email or web address]         

## 2016-08-09 NOTE — Progress Notes (Signed)
Subjective:    Patient ID: Sarah Chambers, female    DOB: 01-28-1976, 41 y.o.   MRN: [id number]  HPI  41 year old female who  has a past medical history of History of kidney stones and Seasonal allergies. She is a patient of Jerelyn Charles, NP. I am seeing her for the first time today. Her acute complaint is that of left sided low back pain. She reports that this started 5 days ago. The pain is described as "constant" but will resolve. Last night, the pain radiated around her left flank. The pain has since resolved but states " I know there is something going on". She does report that urinary frequency and odorous urine. She denies any blood in the urine or dysuria.    Review of Systems  Constitutional: Negative.   Respiratory: Negative.   Cardiovascular: Negative.   Gastrointestinal: Negative.   Genitourinary: Positive for flank pain, frequency and urgency. Negative for dysuria, vaginal bleeding, vaginal discharge and vaginal pain.  All other systems reviewed and are negative.  Past Medical History:  Diagnosis Date  . History of kidney stones   . Seasonal allergies     Social History   Social History  . Marital status: Married    Spouse name: N/A  . Number of children: N/A  . Years of education: N/A   Occupational History  . Not on file.   Social History Main Topics  . Smoking status: Never Smoker  . Smokeless tobacco: Never Used  . Alcohol use No     Comment: socailly   . Drug use: No  . Sexual activity: Yes    Partners: Male   Other Topics Concern  . Not on file   Social History Narrative   ** Merged History Encounter **       Married. 3 children. 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 Enjoys exercising, reading     Past Surgical History:  Procedure Laterality Date  . CESAREAN SECTION     1998, [id number]  . CESAREAN SECTION  1998, 2000, 2007  . TUBAL LIGATION      No family history on  file.  Allergies  Allergen Reactions  . Amoxicillin Hives    Current Outpatient Prescriptions on File Prior to Visit  Medication Sig Dispense Refill  . predniSONE (DELTASONE) 20 MG tablet Take 2 tablets by mouth once daily for 5 days. (Patient not taking: Reported on 08/09/2016) 10 tablet 0   No current facility-administered medications on file prior to visit.     BP 102/68 (BP Location: Left Arm, Patient Position: Sitting, Cuff Size: Normal)   Pulse 78   Temp 98.2 F (36.8 C) (Oral)   Wt 123 lb 9.6 oz (56.1 kg)   SpO2 99%   BMI 22.61 kg/m       Objective:   Physical Exam  Constitutional: She is oriented to person, place, and time. She appears well-developed and well-nourished. No distress.  Cardiovascular: Normal rate, regular rhythm, normal heart sounds and intact distal pulses.  Exam reveals no gallop and no friction rub.   No murmur heard. Pulmonary/Chest: Effort normal and breath sounds normal. No respiratory distress. She has no wheezes. She has no rales. She exhibits no tenderness.  Abdominal: Soft. Bowel sounds are normal. She exhibits no distension and no mass. There is no tenderness. There is no rebound, no guarding and no CVA tenderness.  Neurological: She is alert and oriented to  person, place, and time.  Skin: Skin is warm and dry. No rash noted. She is not diaphoretic. No erythema. No pallor.  Psychiatric: She has a normal mood and affect. Her behavior is normal. Judgment and thought content normal.  Nursing note and vitals reviewed.     Assessment & Plan:  1. Left flank pain - POCT Urinalysis Dipstick (Automated) - + Leuks. No hematuria  - nitrofurantoin, macrocrystal-monohydrate, (MACROBID) 100 MG capsule; Take 1 capsule (100 mg total) by mouth 2 (two) times daily.  Dispense: 10 capsule; Refill: 0 - Culture, Urine - UTI vs Kidney Stone?  - Will treat as UTI  - Advised plenty of fluids.  - Get KUB if no improvement in the next 2-3 days   Shirline Freesory Andreana Klingerman,  NP

## 2016-08-11 LAB — URINE CULTURE

## 2017-02-06 ENCOUNTER — Ambulatory Visit (INDEPENDENT_AMBULATORY_CARE_PROVIDER_SITE_OTHER): Payer: 59 | Admitting: Primary Care

## 2017-02-06 ENCOUNTER — Encounter: Payer: Self-pay | Admitting: Primary Care

## 2017-02-06 VITALS — BP 108/64 | HR 74 | Temp 98.0°F | Ht 62.0 in | Wt 121.8 lb

## 2017-02-06 DIAGNOSIS — H9202 Otalgia, left ear: Secondary | ICD-10-CM | POA: Diagnosis not present

## 2017-02-06 DIAGNOSIS — J3089 Other allergic rhinitis: Secondary | ICD-10-CM

## 2017-02-06 MED ORDER — AZELASTINE HCL 0.1 % NA SOLN: 1.0000 | Freq: Two times a day (BID) | NASAL | 12 refills | Status: DC

## 2017-02-06 MED ORDER — PREDNISONE 10 MG PO TABS: ORAL_TABLET | ORAL | 0 refills | Status: DC

## 2017-02-06 MED ORDER — LEVOCETIRIZINE DIHYDROCHLORIDE 5 MG PO TABS: 5.0000 mg | ORAL_TABLET | Freq: Every evening | ORAL | 0 refills | Status: DC

## 2017-02-06 NOTE — Progress Notes (Signed)
Subjective:    Patient ID: Sarah Chambers, female    DOB: 11-03-1975, 41 y.o.   MRN: [id number]  HPI  Sarah Chambers is a 41 year old female with a history of seasonal allergies who presents today with a chief complaint of ear pain. Her pain is located to the left ear that has been intermittent for the past 3-4 months. She describes her pain as a "deep ache". She's been out on the boat often, also flying several times recently. Yesterday she noticed increased pain to the internal and external ear. Sometimes the wind blowing by will cause pain. She's also noticed intermittent dizziness. She's taking Mucinex, Flonase, Zyrtec/Claritin for seasonal allergies without much improvement. She has been on Singular/Nasonex in the past without much improvement. She describes her pain today as pressure.   Review of Systems  Constitutional: Positive for chills. Negative for fever.  HENT: Positive for ear pain. Negative for congestion and sore throat.   Respiratory: Negative for cough.   Allergic/Immunologic: Positive for environmental allergies.       Past Medical History:  Diagnosis Date  . History of kidney stones   . Seasonal allergies      Social History   Social History  . Marital status: Married    Spouse name: N/A  . Number of children: N/A  . Years of education: N/A   Occupational History  . Not on file.   Social History Main Topics  . Smoking status: Never Smoker  . Smokeless tobacco: Never Used  . Alcohol use No     Comment: socailly   . Drug use: No  . Sexual activity: Yes    Partners: Male   Other Topics Concern  . Not on file   Social History Narrative   ** Merged History Encounter **       Married. 3 children. 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 Enjoys exercising, reading     Past Surgical History:  Procedure Laterality Date  . CESAREAN SECTION     1998, [id number]  . CESAREAN SECTION  1998, 2000, 2007   . TUBAL LIGATION      No family history on file.  Allergies  Allergen Reactions  . Amoxicillin Hives    No current outpatient prescriptions on file prior to visit.   No current facility-administered medications on file prior to visit.     BP 108/64   Pulse 74   Temp 98 F (36.7 C) (Oral)   Ht 5\' 2"  (1.575 m)   Wt 121 lb 12.8 oz (55.2 kg)   LMP 02/06/2017   SpO2 98%   BMI 22.28 kg/m    Objective:   Physical Exam  Constitutional: She appears well-nourished.  HENT:  Right Ear: Tympanic membrane and ear canal normal.  Left Ear: Tympanic membrane and ear canal normal.  Nose: Right sinus exhibits no maxillary sinus tenderness and no frontal sinus tenderness. Left sinus exhibits no maxillary sinus tenderness and no frontal sinus tenderness.  Mouth/Throat: Oropharynx is clear and moist.  Mild bilateral effusion  Eyes: Conjunctivae are normal.  Neck: Neck supple.  Cardiovascular: Normal rate and regular rhythm.   Pulmonary/Chest: Effort normal and breath sounds normal. She has no wheezes. She has no rales.  Lymphadenopathy:    She has no cervical adenopathy.  Skin: Skin is warm and dry.          Assessment & Plan:  Otalgia:  Chronic for 4 months,  intermittent, no improvement. Exam today without evidence of infection, mild effusion. Treat acutely with low dose prednisone taper to relieve pressure and effusion. Rx for Astelin nasal spray and Xyzal sent to pharmacy. Referral to ENT placed for further evaluation given chronic symptoms.  Morrie Sheldonlark,Iver Miklas Kendal, NP

## 2017-02-06 NOTE — Patient Instructions (Signed)
Start prednisone tablets. Take three tablets for 2 days, then two tablets for 2 days, then one tablet for 2 days.  Start Astelin nasal spray, use this in place of Flonase. Spray 1 spray into each nostril twice daily.  Start Xyzal tablets for allergies. Take 1 tablet by mouth at bedtime.  You will be contacted regarding your referral to the ear specialist.  Please let us know if you have not heard back within one week.   It was a pleasure to see you today!

## 2017-02-20 ENCOUNTER — Ambulatory Visit: Payer: 59 | Admitting: Primary Care

## 2017-02-21 ENCOUNTER — Ambulatory Visit (INDEPENDENT_AMBULATORY_CARE_PROVIDER_SITE_OTHER): Payer: 59 | Admitting: Primary Care

## 2017-02-21 ENCOUNTER — Encounter: Payer: Self-pay | Admitting: Primary Care

## 2017-02-21 VITALS — BP 102/64 | HR 75 | Temp 97.6°F | Ht 62.0 in | Wt 122.4 lb

## 2017-02-21 DIAGNOSIS — M62838 Other muscle spasm: Secondary | ICD-10-CM

## 2017-02-21 MED ORDER — METHOCARBAMOL 500 MG PO TABS: 500.0000 mg | ORAL_TABLET | Freq: Three times a day (TID) | ORAL | 0 refills | Status: DC | PRN

## 2017-02-21 MED FILL — METHOCARBAMOL 500 MG TABLET: 500 | 10 days supply | Qty: 30 | Fill #0

## 2017-02-21 NOTE — Patient Instructions (Signed)
Start methocarbamol 500 mg tablets. Take 1 tablet by mouth every 8 hours as needed for neck spasms. Caution as this may cause drowsiness.  Continue ibuprofen, take 800 mg three times daily as needed.  Stretch your neck to prevent stiffness. Continue to apply heat and ice.  It was a pleasure to see you today!   Acute Torticollis, Adult Torticollis is a condition in which the muscles of the neck tighten (contract) abnormally, causing the neck to twist and the head to move into an unnatural position. Torticollis that develops suddenly is called acute torticollis. People with acute torticollis may have trouble turning their head. The condition can be painful and may range from mild to severe. What are the causes? This condition may be caused by:  Sleeping in an awkward position (common).  Extending or twisting the neck muscles beyond their normal position.  An injury to the neck muscles.  An infection.  A tumor.  Certain medicines.  Long-lasting spasms of the neck muscles.  In some cases, the cause may not be known. What increases the risk? You are more likely to develop this condition if:  You have a condition associated with loose ligaments, such as Down syndrome.  You have a brain condition that affects vision, such as strabismus.  What are the signs or symptoms? The main symptom of this condition is tilting of the head to one side. Other symptoms include:  Pain in the neck.  Trouble turning the head from side to side or up and down.  How is this diagnosed? This condition may be diagnosed based on:  A physical exam.  Your medical history.  Imaging tests, such as: ? An X-ray. ? An ultrasound. ? A CT scan. ? An MRI.  How is this treated? Treatment for this condition depends on what is causing the condition. Mild cases may go away without treatment. Treatment for more serious cases may include:  Medicines or shots to relax the muscles.  Other medicines, such  as antibiotics to treat the underlying cause.  Wearing a soft neck collar.  Physical therapy and stretching to improve neck strength and flexibility.  Neck massage.  In severe cases, surgery may be needed to repair dislocated or broken bones or to treat nerves in the neck. Follow these instructions at home:  Take over-the-counter and prescription medicines only as told by your health care provider.  Do stretching exercises and massage your neck as told by your health care provider.  If directed, apply heat to the affected area as often as told by your health care provider. Use the heat source that your health care provider recommends, such as a moist heat pack or a heating pad. ? Place a towel between your skin and the heat source. ? Leave the heat on for 20-30 minutes. ? Remove the heat if your skin turns bright red. This is especially important if you are unable to feel pain, heat, or cold. You may have a greater risk of getting burned.  If you wake up with torticollis after sleeping, check your bed or sleeping area. Look for lumpy pillows or unusual objects. Make sure your bed and sleeping area are comfortable.  Keep all follow-up visits as told by your health care provider. This is important. Contact a health care provider if:  You have a fever.  Your symptoms do not improve or they get worse. Get help right away if:  You have trouble breathing.  You develop noisy breathing (stridor).  You start  to drool.  You have trouble swallowing or pain when swallowing.  You develop numbness or weakness in your hands or feet.  You have changes in your speech, understanding, or vision.  You are in severe pain.  You cannot move your head or neck. Summary  Torticollis is a condition in which the muscles of the neck tighten (contract) abnormally, causing the neck to twist and the head to move into an unnatural position. Torticollis that develops suddenly is called acute  torticollis.  Treatment for this condition depends on what is causing the condition. Mild cases may go away without treatment.  Do stretching exercises and massage your neck as told by your health care provider. You may also be instructed to apply heat to the area.  Contact your health care provider if your symptoms do not improve or they get worse. This information is not intended to replace advice given to you by your health care provider. Make sure you discuss any questions you have with your health care provider. Document Released: 05/19/2000 Document Revised: 07/20/2016 Document Reviewed: 07/20/2016 Elsevier Interactive Patient Education  [employer].

## 2017-02-21 NOTE — Progress Notes (Signed)
Subjective:    Patient ID: Sarah Chambers, female    DOB: 14-Sep-1975, 41 y.o.   MRN: [id number]  HPI  Ms. Sarah Chambers is a 41 year old female who presents today with a chief complaint of neck pain. She describes her pain as a spasm that is located to the left posterior and lateral neck with radiation to her left upper extremity down to her wrist. Her neck spasms suddenly began 6 days ago after stretching when waking. She visited a chiropractor Monday this week, underwent xrays of the neck (early degenerative disc disease, otherwise unremarkable) and underwent "alignment" without improvement. She's been taking Ibuprofen and applying biofreeze without improvement.   Review of Systems  Constitutional: Negative for fever.  Musculoskeletal: Positive for myalgias and neck stiffness.  Neurological: Positive for numbness. Negative for weakness.       Past Medical History:  Diagnosis Date  . History of kidney stones   . Seasonal allergies      Social History   Social History  . Marital status: Married    Spouse name: N/A  . Number of children: N/A  . Years of education: N/A   Occupational History  . Not on file.   Social History Main Topics  . Smoking status: Never Smoker  . Smokeless tobacco: Never Used  . Alcohol use No     Comment: socailly   . Drug use: No  . Sexual activity: Yes    Partners: Male   Other Topics Concern  . Not on file   Social History Narrative   ** Merged History Encounter **       Married. 3 children. 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 Enjoys exercising, reading     Past Surgical History:  Procedure Laterality Date  . CESAREAN SECTION     1998, [id number]  . CESAREAN SECTION  1998, 2000, 2007  . TUBAL LIGATION      No family history on file.  Allergies  Allergen Reactions  . Amoxicillin Hives    Current Outpatient Prescriptions on File Prior to Visit  Medication Sig Dispense  Refill  . azelastine (ASTELIN) 0.1 % nasal spray Place 1 spray into both nostrils 2 (two) times daily. 30 mL 12  . levocetirizine (XYZAL) 5 MG tablet Take 1 tablet (5 mg total) by mouth every evening. 90 tablet 0   No current facility-administered medications on file prior to visit.     BP 102/64   Pulse 75   Temp 97.6 F (36.4 C) (Oral)   Ht  (1.575 m)   Wt 122 lb 6.4 oz (55.5 kg)   LMP 02/06/2017   SpO2 99%   BMI 22.39 kg/m    Objective:   Physical Exam  Constitutional: She appears well-nourished.  Neck: Neck supple. No spinous process tenderness and no muscular tenderness present. Decreased range of motion present.  Obvious muscle tension to left lateral neck. Decrease in ROM with flexion, extension, left lateral rotation.   Cardiovascular: Normal rate.   Skin: Skin is warm and dry.          Assessment & Plan:  Acute Torticollis:  Occurred after stretching when waking up.  No improvement with chiropractor interventions and ibuprofen. Exam today with obvious muscle spasm, likely compression of nerve causing radiation to her left upper extremity.  Rx for methocarbamol sent to pharmacy, drowsiness precautions provided. Discussed to take with Ibuprofen. Continue heat/ice, discussed stretching exercises. Follow up  PRN.  Sarah Sheldon, NP

## 2017-03-02 ENCOUNTER — Encounter: Payer: Self-pay | Admitting: Primary Care

## 2017-03-02 DIAGNOSIS — M542 Cervicalgia: Secondary | ICD-10-CM

## 2017-03-02 MED ORDER — PREDNISONE 10 MG PO TABS: ORAL_TABLET | ORAL | 0 refills | Status: DC

## 2017-03-02 MED FILL — predniSONE 10 MG TABS: 10 | 8 days supply | Qty: 20 | Fill #0

## 2017-03-12 ENCOUNTER — Other Ambulatory Visit: Payer: Self-pay | Admitting: Primary Care

## 2017-03-12 ENCOUNTER — Ambulatory Visit
Admission: RE | Admit: 2017-03-12 | Discharge: 2017-03-12 | Disposition: A | Discharge status: Home / Self Care | Payer: 59 | Source: Ambulatory Visit | Attending: Primary Care | Admitting: Primary Care

## 2017-03-12 ENCOUNTER — Encounter: Payer: Self-pay | Admitting: Primary Care

## 2017-03-12 DIAGNOSIS — M542 Cervicalgia: Secondary | ICD-10-CM

## 2017-03-20 ENCOUNTER — Encounter: Payer: Self-pay | Admitting: Primary Care

## 2017-07-02 ENCOUNTER — Ambulatory Visit (INDEPENDENT_AMBULATORY_CARE_PROVIDER_SITE_OTHER): Payer: No Typology Code available for payment source | Admitting: Primary Care

## 2017-07-02 VITALS — BP 100/66 | HR 75 | Temp 98.6°F | Ht 62.0 in | Wt 119.0 lb

## 2017-07-02 DIAGNOSIS — Z0000 Encounter for general adult medical examination without abnormal findings: Secondary | ICD-10-CM | POA: Diagnosis not present

## 2017-07-02 DIAGNOSIS — J302 Other seasonal allergic rhinitis: Secondary | ICD-10-CM

## 2017-07-02 DIAGNOSIS — J3089 Other allergic rhinitis: Secondary | ICD-10-CM | POA: Diagnosis not present

## 2017-07-02 DIAGNOSIS — Z0001 Encounter for general adult medical examination with abnormal findings: Secondary | ICD-10-CM | POA: Insufficient documentation

## 2017-07-02 MED ORDER — LEVOCETIRIZINE DIHYDROCHLORIDE 5 MG PO TABS: 5.0000 mg | ORAL_TABLET | Freq: Every evening | ORAL | 3 refills | Status: DC

## 2017-07-02 MED ORDER — AZELASTINE HCL 0.1 % NA SOLN: 1.0000 | Freq: Two times a day (BID) | NASAL | 5 refills | Status: DC

## 2017-07-02 NOTE — Progress Notes (Signed)
Subjective:    Patient ID: Sarah Chambers, female    DOB: 1975-08-04, 42 y.o.   MRN: [id number]  HPI  Sarah Chambers is a 42 year old female who presents today for complete physical.  Immunizations: -Tetanus: Completed within 10 years.  -Influenza: Completed this season   Diet: She endorses a healthy diet. Breakfast: Fast food Lunch: Salad Dinner: Meat, vegetables, starch Snacks: None Desserts: Daily Beverages: Water, diet coke  Exercise: She exercises 0-3 days weekly. Eye exam: Completed 6 months ago. Dental exam: Completes semi-annually Pap Smear: Completed in 2016 Mammogram: Never completed, due.   Review of Systems  Constitutional: Negative for unexpected weight change.  HENT: Negative for rhinorrhea.   Respiratory: Negative for cough and shortness of breath.   Cardiovascular: Negative for chest pain.  Gastrointestinal: Negative for constipation and diarrhea.  Genitourinary: Negative for difficulty urinating and menstrual problem.  Musculoskeletal: Negative for arthralgias and myalgias.  Skin: Negative for rash.  Allergic/Immunologic: Positive for environmental allergies.  Neurological: Negative for dizziness, numbness and headaches.  Psychiatric/Behavioral: The patient is not nervous/anxious.        Past Medical History:  Diagnosis Date  . History of kidney stones   . Seasonal allergies      Social History   Socioeconomic History  . Marital status: Married    Spouse name: Not on file  . Number of children: Not on file  . Years of education: Not on file  . Highest education level: Not on file  Social Needs  . Financial resource strain: Not on file  . Food insecurity - worry: Not on file  . Food insecurity - inability: Not on file  . Transportation needs - medical: Not on file  . Transportation needs - non-medical: Not on file  Occupational History  . Not on file  Tobacco Use  . Smoking status: Never Smoker  . Smokeless tobacco: Never Used    Substance and Sexual Activity  . Alcohol use: No    Alcohol/week: 0.0 oz    Comment: socailly   . Drug use: No  . Sexual activity: Yes    Partners: Male  Other Topics Concern  . Not on file  Social History Narrative   ** Merged History Encounter **       Married. 3 children. 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 Enjoys exercising, reading     Past Surgical History:  Procedure Laterality Date  . CESAREAN SECTION     1998, [id number]  . CESAREAN SECTION  1998, 2000, 2007  . TUBAL LIGATION      No family history on file.  Allergies  Allergen Reactions  . Amoxicillin Hives    No current outpatient medications on file prior to visit.   No current facility-administered medications on file prior to visit.     BP 100/66   Pulse 75   Temp 98.6 F (37 C) (Oral)   Ht 5\' 2"  (1.575 m)   Wt 119 lb (54 kg)   SpO2 98%   BMI 21.77 kg/m    Objective:   Physical Exam  Constitutional: She is oriented to person, place, and time. She appears well-nourished.  HENT:  Right Ear: Tympanic membrane and ear canal normal.  Left Ear: Tympanic membrane and ear canal normal.  Nose: Nose normal.  Mouth/Throat: Oropharynx is clear and moist.  Eyes: Conjunctivae and EOM are normal. Pupils are equal, round, and reactive to light.  Neck: Neck  supple. No thyromegaly present.  Cardiovascular: Normal rate and regular rhythm.  No murmur heard. Pulmonary/Chest: Effort normal and breath sounds normal. She has no rales.  Abdominal: Soft. Bowel sounds are normal. There is no tenderness.  Musculoskeletal: Normal range of motion.  Lymphadenopathy:    She has no cervical adenopathy.  Neurological: She is alert and oriented to person, place, and time. She has normal reflexes. No cranial nerve deficit.  Skin: Skin is warm and dry. No rash noted.  Psychiatric: She has a normal mood and affect.          Assessment & Plan:

## 2017-07-02 NOTE — Patient Instructions (Signed)
Stop by the lab prior to leaving today. I will notify you of your results once received.   Call the Breast Center to schedule your mammogram.  Call the Gynecologist to schedule your pap smear.  Continue exercising. You should be getting 150 minutes of moderate intensity exercise weekly.  Increase vegetables, fruit, whole grains, lean protein. Limit fast food/fried food/fatty food.  Ensure you are consuming 64 ounces of water daily.  Follow up in 1 year for your annual exam or sooner if needed.  It was a pleasure to see you today!

## 2017-07-02 NOTE — Assessment & Plan Note (Signed)
Doing well on Astelin, Xyzal, and Mucinex PRN. Continue same, refills sent to pharmacy.

## 2017-07-02 NOTE — Assessment & Plan Note (Signed)
Immunizations UTD. Pap smear due, she will follow up with GYN. Mammogram due, pending. Recommended regular exercise and improvements in diet. Exam unremarkable. Labs pending. Follow up in 1 year.

## 2017-07-03 LAB — LIPID PANEL
Cholesterol: 166 mg/dL (ref 0–200)
HDL: 68.9 mg/dL (ref >39.00)
LDL Cholesterol: 89 mg/dL (ref 0–99)
NONHDL: 96.7
TRIGLYCERIDES: 40 mg/dL (ref 0.0–149.0)
Total CHOL/HDL Ratio: 2
VLDL: 8 mg/dL (ref 0.0–40.0)

## 2017-07-03 LAB — COMPREHENSIVE METABOLIC PANEL WITH GFR
ALT: 13 U/L (ref 0–35)
AST: 17 U/L (ref 0–37)
Albumin: 4.2 g/dL (ref 3.5–5.2)
Alkaline Phosphatase: 50 U/L (ref 39–117)
BUN: 10 mg/dL (ref 6–23)
CO2: 26 meq/L (ref 19–32)
Calcium: 8.9 mg/dL (ref 8.4–10.5)
Chloride: 104 meq/L (ref 96–112)
Creatinine, Ser: 0.72 mg/dL (ref 0.40–1.20)
GFR: 93.91 mL/min
Glucose, Bld: 86 mg/dL (ref 70–99)
Potassium: 3.7 meq/L (ref 3.5–5.1)
Sodium: 137 meq/L (ref 135–145)
Total Bilirubin: 0.4 mg/dL (ref 0.2–1.2)
Total Protein: 6.5 g/dL (ref 6.0–8.3)

## 2017-07-31 ENCOUNTER — Encounter: Payer: Self-pay | Admitting: Primary Care

## 2017-07-31 ENCOUNTER — Telehealth: Payer: No Typology Code available for payment source | Admitting: Nurse Practitioner

## 2017-07-31 DIAGNOSIS — R059 Cough, unspecified: Secondary | ICD-10-CM

## 2017-07-31 DIAGNOSIS — R05 Cough: Secondary | ICD-10-CM

## 2017-07-31 MED ORDER — BENZONATATE 100 MG PO CAPS: 100.0000 mg | ORAL_CAPSULE | Freq: Three times a day (TID) | ORAL | 0 refills | Status: DC | PRN

## 2017-07-31 MED ORDER — PREDNISONE 10 MG (21) PO TBPK: ORAL_TABLET | ORAL | 0 refills | Status: DC

## 2017-07-31 MED FILL — predniSONE 10 MG (21) TBPK: 10 | 6 days supply | Qty: 21 | Fill #0

## 2017-07-31 MED FILL — BENZONATATE 100 MG CAP: 100 | 6 days supply | Qty: 20 | Fill #0

## 2017-07-31 NOTE — Progress Notes (Signed)

## 2017-08-29 ENCOUNTER — Other Ambulatory Visit: Payer: Self-pay | Admitting: Primary Care

## 2017-08-29 DIAGNOSIS — Z1231 Encounter for screening mammogram for malignant neoplasm of breast: Secondary | ICD-10-CM

## 2017-09-03 MED FILL — AZELASTINE HCL 137 MCG/SPRA: 137 | 50 days supply | Qty: 30 | Fill #0

## 2017-09-03 MED FILL — LEVOCETIRIZINE 5 MG TABLET: 5 | 90 days supply | Qty: 90 | Fill #0

## 2017-10-05 ENCOUNTER — Ambulatory Visit
Admission: RE | Admit: 2017-10-05 | Discharge: 2017-10-05 | Disposition: A | Discharge status: Home / Self Care | Payer: No Typology Code available for payment source | Source: Ambulatory Visit | Attending: Primary Care | Admitting: Primary Care

## 2017-10-05 DIAGNOSIS — Z1231 Encounter for screening mammogram for malignant neoplasm of breast: Secondary | ICD-10-CM

## 2018-01-16 ENCOUNTER — Ambulatory Visit (INDEPENDENT_AMBULATORY_CARE_PROVIDER_SITE_OTHER): Payer: No Typology Code available for payment source | Admitting: Family Medicine

## 2018-01-16 ENCOUNTER — Encounter: Payer: Self-pay | Admitting: Family Medicine

## 2018-01-16 VITALS — BP 116/72 | HR 72 | Temp 98.4°F | Ht 62.0 in | Wt 120.0 lb

## 2018-01-16 DIAGNOSIS — H6992 Unspecified Eustachian tube disorder, left ear: Secondary | ICD-10-CM

## 2018-01-16 DIAGNOSIS — J3089 Other allergic rhinitis: Secondary | ICD-10-CM | POA: Diagnosis not present

## 2018-01-16 MED ORDER — FLUTICASONE PROPIONATE 50 MCG/ACT NA SUSP: 2.0000 | Freq: Every day | NASAL | 6 refills | Status: AC

## 2018-01-16 NOTE — Progress Notes (Signed)
   Subjective:    Patient ID: Sarah Chambers, female    DOB: 1976/04/05, 42 y.o.   MRN: [id number]  HPI This is a 42 yo female who presents today with left otalgia. Pressure feeling, no drainage.  She has frequent allergy symptoms and takes mucinex D (12 hour), azelastine, alternates between Zyrtec and Xyzal. Has been taking ibuprofen 800 mg qd with a little relief.  Had a recent cold and international flight, noticed symptoms after. Dry cough, no fever. Flying tomorrow.  Has never seen allergist or ENT.   Past Medical History:  Diagnosis Date  . History of kidney stones   . Seasonal allergies    Past Surgical History:  Procedure Laterality Date  . CESAREAN SECTION     1998, [id number],2007  . CESAREAN SECTION  1998, 2000, 2007  . TUBAL LIGATION     No family history on file. Social History   Tobacco Use  . Smoking status: Never Smoker  . Smokeless tobacco: Never Used  Substance Use Topics  . Alcohol use: No    Alcohol/week: 0.0 standard drinks    Comment: socailly   . Drug use: No      Review of Systems Per HPI    Objective:   Physical Exam  Constitutional: She is oriented to person, place, and time. She appears well-developed and well-nourished. No distress.  HENT:  Head: Normocephalic and atraumatic.  Right Ear: Tympanic membrane, external ear and ear canal normal.  Left Ear: External ear and ear canal normal.  Nose: Mucosal edema and rhinorrhea present.  Mouth/Throat: Uvula is midline, oropharynx is clear and moist and mucous membranes are normal.  Left TM dull, non tender to exam. Small nares.   Eyes: Conjunctivae are normal.  Neck: Normal range of motion. Neck supple.  Cardiovascular: Normal rate, regular rhythm and normal heart sounds.  Pulmonary/Chest: Effort normal and breath sounds normal.  Lymphadenopathy:    She has no cervical adenopathy.  Neurological: She is alert and oriented to person, place, and time.  Skin: Skin is warm and dry. She is not  diaphoretic.  Psychiatric: She has a normal mood and affect. Her behavior is normal. Judgment and thought content normal.      BP 116/72 (BP Location: Right Arm, Patient Position: Sitting, Cuff Size: Normal)   Pulse 72   Temp 98.4 F (36.9 C) (Oral)   Ht 5\' 2"  (1.575 m)   Wt 120 lb (54.4 kg)   LMP 12/26/2017   SpO2 99%   BMI 21.95 kg/m  Wt Readings from Last 3 Encounters:  01/16/18 120 lb (54.4 kg)  07/02/17 119 lb (54 kg)  02/21/17 122 lb 6.4 oz (55.5 kg)       Assessment & Plan:  1. Disorder of left eustachian tube - Provided written and verbal information regarding diagnosis and treatment. - add fluticasone, Afrin, Delsym - fluticasone (FLONASE) 50 MCG/ACT nasal spray; Place 2 sprays into both nostrils daily.  Dispense: 16 g; Refill: 6 - RTC precautions reviewed - if no improvement with above, consider ENT referral  2. Non-seasonal allergic rhinitis, unspecified trigger - see #1 - has not seen allergist in past, discussed possible need in future   Olean Reeeborah Brieann Osinski, FNP-BC   [facility]  01/16/2018 12:08 PM

## 2018-01-16 NOTE — Patient Instructions (Signed)
Good to see you today Please use Afrin nasal twice a day for 3-4 days Use fluticasone nasal spray twice a day after Afrin for 3 days then at bedtime- use about 30 minutes before take off Use Delsym for cough If not better in a couple of weeks, consider ENT referral   Eustachian Tube Dysfunction The eustachian tube connects the middle ear to the back of the nose. It regulates air pressure in the middle ear by allowing air to move between the ear and nose. It also helps to drain fluid from the middle ear space. When the eustachian tube does not function properly, air pressure, fluid, or both can build up in the middle ear. Eustachian tube dysfunction can affect one or both ears. What are the causes? This condition happens when the eustachian tube becomes blocked or cannot open normally. This may result from:  Ear infections.  Colds and other upper respiratory infections.  Allergies.  Irritation, such as from cigarette smoke or acid from the stomach coming up into the esophagus (gastroesophageal reflux).  Sudden changes in air pressure, such as from descending in an airplane.  Abnormal growths in the nose or throat, such as nasal polyps, tumors, or enlarged tissue at the back of the throat (adenoids).  What increases the risk? This condition may be more likely to develop in people who smoke and people who are overweight. Eustachian tube dysfunction may also be more likely to develop in children, especially children who have:  Certain birth defects of the mouth, such as cleft palate.  Large tonsils and adenoids.  What are the signs or symptoms? Symptoms of this condition may include:  A feeling of fullness in the ear.  Ear pain.  Clicking or popping noises in the ear.  Ringing in the ear.  Hearing loss.  Loss of balance.  Symptoms may get worse when the air pressure around you changes, such as when you travel to an area of high elevation or fly on an airplane. How is this  diagnosed? This condition may be diagnosed based on:  Your symptoms.  A physical exam of your ear, nose, and throat.  Tests, such as those that measure: ? The movement of your eardrum (tympanogram). ? Your hearing (audiometry).  How is this treated? Treatment depends on the cause and severity of your condition. If your symptoms are mild, you may be able to relieve your symptoms by moving air into ("popping") your ears. If you have symptoms of fluid in your ears, treatment may include:  Decongestants.  Antihistamines.  Nasal sprays or ear drops that contain medicines that reduce swelling (steroids).  In some cases, you may need to have a procedure to drain the fluid in your eardrum (myringotomy). In this procedure, a small tube is placed in the eardrum to:  Drain the fluid.  Restore the air in the middle ear space.  Follow these instructions at home:  Take over-the-counter and prescription medicines only as told by your health care provider.  Use techniques to help pop your ears as recommended by your health care provider. These may include: ? Chewing gum. ? Yawning. ? Frequent, forceful swallowing. ? Closing your mouth, holding your nose closed, and gently blowing as if you are trying to blow air out of your nose.  Do not do any of the following until your health care provider approves: ? Travel to high altitudes. ? Fly in airplanes. ? Work in a Estate agentpressurized cabin or room. ? Scuba dive.  Keep your  ears dry. Dry your ears completely after showering or bathing.  Do not smoke.  Keep all follow-up visits as told by your health care provider. This is important. Contact a health care provider if:  Your symptoms do not go away after treatment.  Your symptoms come back after treatment.  You are unable to pop your ears.  You have: ? A fever. ? Pain in your ear. ? Pain in your head or neck. ? Fluid draining from your ear.  Your hearing suddenly changes.  You become  very dizzy.  You lose your balance. This information is not intended to replace advice given to you by your health care provider. Make sure you discuss any questions you have with your health care provider. Document Released: 06/18/2015 Document Revised: 10/28/2015 Document Reviewed: 06/10/2014 Elsevier Interactive Patient Education  [employer]2018 Elsevier Inc.

## 2018-04-15 ENCOUNTER — Encounter: Payer: Self-pay | Admitting: Internal Medicine

## 2018-04-15 ENCOUNTER — Ambulatory Visit (INDEPENDENT_AMBULATORY_CARE_PROVIDER_SITE_OTHER): Payer: No Typology Code available for payment source | Admitting: Internal Medicine

## 2018-04-15 VITALS — BP 104/68 | HR 72 | Temp 98.2°F | Ht 62.0 in | Wt 121.8 lb

## 2018-04-15 DIAGNOSIS — H1032 Unspecified acute conjunctivitis, left eye: Secondary | ICD-10-CM | POA: Diagnosis not present

## 2018-04-15 MED ORDER — CLINDAMYCIN HCL 300 MG PO CAPS: 300.0000 mg | ORAL_CAPSULE | Freq: Three times a day (TID) | ORAL | 0 refills | Status: DC

## 2018-04-15 MED ORDER — ERYTHROMYCIN 5 MG/GM OP OINT: 1.0000 "application " | TOPICAL_OINTMENT | Freq: Four times a day (QID) | OPHTHALMIC | 0 refills | Status: DC

## 2018-04-15 NOTE — Assessment & Plan Note (Signed)
Pain suggestive of trauma--but none Will treat with ointment Antibiotic due to symptoms of apparent underlying sinus infection Analgesics If no better tomorrow morning --call for emergency eye appt

## 2018-04-15 NOTE — Patient Instructions (Signed)
Please see your eye doctor tomorrow if the eye pain is not considerably better.

## 2018-04-15 NOTE — Progress Notes (Signed)
Subjective:    Patient ID: Sarah Chambers, female    DOB: 04-14-1976, 42 y.o.   MRN: [id number]  HPI Here due to left eye redness  Very painful eye and the whole side of left face is painful Headache frontal also Very sensitive to light This started 2 days ago  No fever No injury or trauma Is prone to sinus problems---but never had the eye pain Takes allergy meds and mucinex D regularly Some sore throat from drainage--nose and down throat Constant watering----clear like tears Some pressure in left ear  No other Rx other than allergy meds  Current Outpatient Medications on File Prior to Visit  Medication Sig Dispense Refill  . azelastine (ASTELIN) 0.1 % nasal spray Place 1 spray into both nostrils 2 (two) times daily. 30 mL 5  . fluticasone (FLONASE) 50 MCG/ACT nasal spray Place 2 sprays into both nostrils daily. 16 g 6  . levocetirizine (XYZAL) 5 MG tablet Take 1 tablet (5 mg total) by mouth every evening. 90 tablet 3   No current facility-administered medications on file prior to visit.     Allergies  Allergen Reactions  . Amoxicillin Hives    Past Medical History:  Diagnosis Date  . History of kidney stones   . Seasonal allergies     Past Surgical History:  Procedure Laterality Date  . CESAREAN SECTION     1998, [id number]  . CESAREAN SECTION  1998, 2000, 2007  . TUBAL LIGATION      History reviewed. No pertinent family history.  Social History   Socioeconomic History  . Marital status: Married    Spouse name: Not on file  . Number of children: Not on file  . Years of education: Not on file  . Highest education level: Not on file  Occupational History  . Not on file  Social Needs  . Financial resource strain: Not on file  . Food insecurity:    Worry: Not on file    Inability: Not on file  . Transportation needs:    Medical: Not on file    Non-medical: Not on file  Tobacco Use  . Smoking status: Never Smoker  . Smokeless tobacco: Never Used   Substance and Sexual Activity  . Alcohol use: No    Alcohol/week: 0.0 standard drinks    Comment: socailly   . Drug use: No  . Sexual activity: Yes    Partners: Male  Lifestyle  . Physical activity:    Days per week: Not on file    Minutes per session: Not on file  . Stress: Not on file  Relationships  . Social connections:    Talks on phone: Not on file    Gets together: Not on file    Attends religious service: Not on file    Active member of club or organization: Not on file    Attends meetings of clubs or organizations: Not on file    Relationship status: Not on file  . Intimate partner violence:    Fear of current or ex partner: Not on file    Emotionally abused: Not on file    Physically abused: Not on file    Forced sexual activity: Not on file  Other Topics Concern  . Not on file  Social History Narrative   ** Merged History Encounter **       Married. 3 children. 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 Enjoys exercising,  reading    Review of Systems Vision is blurry from left eye No vomiting or diarrhea Appetite is off    Objective:   Physical Exam  HENT:  Mouth/Throat: Oropharynx is clear and moist. No oropharyngeal exudate.  No clear sinus tenderness Moderate nasal congestion TMs normal  Eyes:  Conjunctival injection No obvious corneal damage but eye is sensitive Upper lid especially swollen but not too red EOM full   Neck: No thyromegaly present.  Respiratory: Effort normal and breath sounds normal. No respiratory distress. She has no wheezes. She has no rales.  Lymphadenopathy:    She has no cervical adenopathy.           Assessment & Plan:

## 2018-04-16 MED FILL — VALACYCLOVIR HCL 500 MG TAB: 500 | 14 days supply | Qty: 42 | Fill #0

## 2018-04-16 MED FILL — MOXIFLOXACIN HCL 0.5 % SOLN: 0.5 | 10 days supply | Qty: 3 | Fill #0

## 2018-09-04 MED FILL — FLUTICASONE PROP 50 MCG SPR: 50 | 30 days supply | Qty: 16 | Fill #0

## 2018-11-25 ENCOUNTER — Ambulatory Visit (INDEPENDENT_AMBULATORY_CARE_PROVIDER_SITE_OTHER): Payer: No Typology Code available for payment source | Admitting: Primary Care

## 2018-11-25 ENCOUNTER — Encounter: Payer: Self-pay | Admitting: Primary Care

## 2018-11-25 VITALS — Wt 121.0 lb

## 2018-11-25 DIAGNOSIS — Z1239 Encounter for other screening for malignant neoplasm of breast: Secondary | ICD-10-CM

## 2018-11-25 DIAGNOSIS — J302 Other seasonal allergic rhinitis: Secondary | ICD-10-CM

## 2018-11-25 DIAGNOSIS — Z0000 Encounter for general adult medical examination without abnormal findings: Secondary | ICD-10-CM

## 2018-11-25 DIAGNOSIS — R61 Generalized hyperhidrosis: Secondary | ICD-10-CM | POA: Insufficient documentation

## 2018-11-25 NOTE — Assessment & Plan Note (Signed)
Intermittent for the last one year, progressed. Check labs including TSH, CBC, CMP. She will also be scheduling a visit with her GYN.

## 2018-11-25 NOTE — Assessment & Plan Note (Signed)
Immunizations UTD per patient. Pap smear overdue, she will schedule an appointment with GYN.  Mammogram due, orders placed. She will call to schedule. Encouraged her to continue with a healthy lifestyle. Virtual exam unermarkable. She appears well. Labs pending. Follow up in 1 year for CPE.

## 2018-11-25 NOTE — Patient Instructions (Signed)
Call the main line to schedule a lab only appointment.  Follow up with your gynecologist as discussed.  Call the breast center to schedule your mammogram.   Continue exercising. You should be getting 150 minutes of moderate intensity exercise weekly.  Continue to work on a healthy diet.  It was a pleasure to see you today! Allie Bossier, NP-C

## 2018-11-25 NOTE — Assessment & Plan Note (Signed)
Doing well on Flonase for which she uses daily. Continue same.

## 2018-11-25 NOTE — Progress Notes (Signed)
Subjective:    Patient ID: Sarah Chambers, female    DOB: 04-25-1976, 43 y.o.   MRN: [id number]  HPI  Virtual Visit via Video Note  I connected with Sarah Chambers on 11/25/18 at  2:00 PM EDT by a video enabled telemedicine application and verified that I am speaking with the correct person using two identifiers.  Location: Patient: Home Provider: Office   I discussed the limitations of evaluation and management by telemedicine and the availability of in person appointments. The patient expressed understanding and agreed to proceed.  History of Present Illness:  Sarah Chambers is a 43 year old female who presents today for complete physical.  Immunizations: -Tetanus: Unsure. Thinks it's been around 2016. -Influenza: Completes annually   Diet: She endorses a healthy diet. She is eating more home cooked meals. She is drinking diet soda and water.  Exercise: She is walking/jogging 5 miles or bicycle 5 days weekly.  Eye exam: Completed in 2020 Dental exam: Completes semi-annually Pap Smear: Completed in 2016, she will call her GYN for an appointment. Mammogram: Completed in May 2019    Observations/Objective:  Alert and oriented. Appears well, not sickly. No distress. Speaking in complete sentences.   Assessment and Plan:  See problem based charting.  Follow Up Instructions:  Call the main line to schedule a lab only appointment.  Follow up with your gynecologist as discussed.  Call the breast center to schedule your mammogram.   Continue exercising. You should be getting 150 minutes of moderate intensity exercise weekly.  Continue to work on a healthy diet.  It was a pleasure to see you today! Mayra ReelKate Jet Traynham, NP-C    I discussed the assessment and treatment plan with the patient. The patient was provided an opportunity to ask questions and all were answered. The patient agreed with the plan and demonstrated an understanding of the instructions.   The  patient was advised to call back or seek an in-person evaluation if the symptoms worsen or if the condition fails to improve as anticipated.     Doreene NestKatherine K Demyan Fugate, NP    Review of Systems  Constitutional: Negative for unexpected weight change.       Night sweats several nights weekly for the last one year. Progressed over time. Denies day hot flashes, unexplained weight loss, palpitations.   HENT: Negative for rhinorrhea.   Respiratory: Negative for cough and shortness of breath.   Cardiovascular: Negative for chest pain.  Gastrointestinal: Negative for constipation and diarrhea.  Genitourinary: Negative for difficulty urinating and menstrual problem.  Musculoskeletal: Negative for arthralgias and myalgias.  Skin: Negative for rash.  Allergic/Immunologic: Negative for environmental allergies.  Neurological: Negative for dizziness, numbness and headaches.  Psychiatric/Behavioral: The patient is not nervous/anxious.        Past Medical History:  Diagnosis Date  . History of kidney stones   . Seasonal allergies      Social History   Socioeconomic History  . Marital status: Married    Spouse name: Not on file  . Number of children: Not on file  . Years of education: Not on file  . Highest education level: Not on file  Occupational History  . Not on file  Social Needs  . Financial resource strain: Not on file  . Food insecurity    Worry: Not on file    Inability: Not on file  . Transportation needs    Medical: Not on file    Non-medical: Not on file  Tobacco Use  . Smoking status: Never Smoker  . Smokeless tobacco: Never Used  Substance and Sexual Activity  . Alcohol use: No    Alcohol/week: 0.0 standard drinks    Comment: socailly   . Drug use: No  . Sexual activity: Yes    Partners: Male  Lifestyle  . Physical activity    Days per week: Not on file    Minutes per session: Not on file  . Stress: Not on file  Relationships  . Social Herbalist on  phone: Not on file    Gets together: Not on file    Attends religious service: Not on file    Active member of club or organization: Not on file    Attends meetings of clubs or organizations: Not on file    Relationship status: Not on file  . Intimate partner violence    Fear of current or ex partner: Not on file    Emotionally abused: Not on file    Physically abused: Not on file    Forced sexual activity: Not on file  Other Topics Concern  . Not on file  Social History Narrative   ** Merged History Encounter **       Married. 3 children. 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 Highest level of education [employer]. Enjoys exercising, reading     Past Surgical History:  Procedure Laterality Date  . [place], [id number]  . [place], 2000, 2007  . TUBAL LIGATION      No family history on file.  Allergies  Allergen Reactions  . Amoxicillin Hives    Current Outpatient Medications on File Prior to Visit  Medication Sig Dispense Refill  . cetirizine (ZYRTEC) 10 MG tablet Take 10 mg by mouth daily.    . fluticasone (FLONASE) 50 MCG/ACT nasal spray Place 2 sprays into both nostrils daily. 16 g 6   No current facility-administered medications on file prior to visit.     Wt 121 lb (54.9 kg)   LMP 11/04/2018   BMI 22.13 kg/m    Objective:   Physical Exam  Constitutional: She is oriented to person, place, and time. She appears well-nourished.  Neck: Neck supple.  Respiratory: Effort normal. No respiratory distress.  Musculoskeletal: Normal range of motion.  Neurological: She is alert and oriented to person, place, and time.  Skin: Skin is dry.  Psychiatric: She has a normal mood and affect.           Assessment & Plan:

## 2019-01-13 ENCOUNTER — Other Ambulatory Visit: Payer: No Typology Code available for payment source

## 2020-01-29 ENCOUNTER — Other Ambulatory Visit: Payer: Self-pay | Admitting: Primary Care

## 2020-01-29 DIAGNOSIS — Z1231 Encounter for screening mammogram for malignant neoplasm of breast: Secondary | ICD-10-CM

## 2020-02-06 ENCOUNTER — Other Ambulatory Visit: Payer: Self-pay | Admitting: Primary Care

## 2020-02-06 DIAGNOSIS — Z1159 Encounter for screening for other viral diseases: Secondary | ICD-10-CM

## 2020-02-06 DIAGNOSIS — Z114 Encounter for screening for human immunodeficiency virus [HIV]: Secondary | ICD-10-CM

## 2020-02-06 DIAGNOSIS — Z0000 Encounter for general adult medical examination without abnormal findings: Secondary | ICD-10-CM

## 2020-02-17 ENCOUNTER — Other Ambulatory Visit: Payer: Self-pay

## 2020-02-17 ENCOUNTER — Other Ambulatory Visit (INDEPENDENT_AMBULATORY_CARE_PROVIDER_SITE_OTHER): Payer: No Typology Code available for payment source

## 2020-02-17 ENCOUNTER — Other Ambulatory Visit: Payer: Self-pay | Admitting: Primary Care

## 2020-02-17 DIAGNOSIS — Z1159 Encounter for screening for other viral diseases: Secondary | ICD-10-CM

## 2020-02-17 DIAGNOSIS — Z0000 Encounter for general adult medical examination without abnormal findings: Secondary | ICD-10-CM | POA: Diagnosis not present

## 2020-02-17 DIAGNOSIS — Z114 Encounter for screening for human immunodeficiency virus [HIV]: Secondary | ICD-10-CM

## 2020-02-17 LAB — COMPREHENSIVE METABOLIC PANEL
ALT: 20 U/L (ref 0–35)
AST: 21 U/L (ref 0–37)
Albumin: 4.6 g/dL (ref 3.5–5.2)
Alkaline Phosphatase: 58 U/L (ref 39–117)
BUN: 11 mg/dL (ref 6–23)
CO2: 29 mEq/L (ref 19–32)
Calcium: 9.2 mg/dL (ref 8.4–10.5)
Chloride: 101 mEq/L (ref 96–112)
Creatinine, Ser: 0.73 mg/dL (ref 0.40–1.20)
GFR: 85.93 mL/min (ref >60.00)
Glucose, Bld: 87 mg/dL (ref 70–99)
Potassium: 3.7 mEq/L (ref 3.5–5.1)
Sodium: 138 mEq/L (ref 135–145)
Total Bilirubin: 0.6 mg/dL (ref 0.2–1.2)
Total Protein: 7.1 g/dL (ref 6.0–8.3)

## 2020-02-17 LAB — CBC
HCT: 38.8 % (ref 36.0–46.0)
Hemoglobin: 13.1 g/dL (ref 12.0–15.0)
MCHC: 33.8 g/dL (ref 30.0–36.0)
MCV: 96.7 fl (ref 78.0–100.0)
Platelets: 213 10*3/uL (ref 150.0–400.0)
RBC: 4.01 Mil/uL (ref 3.87–5.11)
RDW: 12.9 % (ref 11.5–15.5)
WBC: 4.5 10*3/uL (ref 4.0–10.5)

## 2020-02-17 LAB — LIPID PANEL
Cholesterol: 216 mg/dL — ABNORMAL HIGH (ref 0–200)
HDL: 72.8 mg/dL (ref >39.00)
LDL Cholesterol: 130 mg/dL — ABNORMAL HIGH (ref 0–99)
NonHDL: 143.63
Total CHOL/HDL Ratio: 3
Triglycerides: 69 mg/dL (ref 0.0–149.0)
VLDL: 13.8 mg/dL (ref 0.0–40.0)

## 2020-02-17 LAB — TSH: TSH: 3.31 u[IU]/mL (ref 0.35–4.50)

## 2020-02-17 NOTE — Addendum Note (Signed)
Addended by: Alvina Chou on: 02/17/2020 02:48 PM   Modules accepted: Orders

## 2020-02-19 LAB — HEPATITIS C ANTIBODY
Hepatitis C Ab: NONREACTIVE
SIGNAL TO CUT-OFF: 0 (ref <1.00)

## 2020-02-19 LAB — HIV ANTIBODY (ROUTINE TESTING W REFLEX): HIV 1&2 Ab, 4th Generation: NONREACTIVE

## 2020-02-24 ENCOUNTER — Other Ambulatory Visit: Payer: Self-pay

## 2020-02-24 ENCOUNTER — Ambulatory Visit (INDEPENDENT_AMBULATORY_CARE_PROVIDER_SITE_OTHER): Payer: No Typology Code available for payment source | Admitting: Primary Care

## 2020-02-24 ENCOUNTER — Encounter: Payer: Self-pay | Admitting: Primary Care

## 2020-02-24 VITALS — BP 120/64 | HR 85 | Temp 97.5°F | Ht 62.5 in | Wt 123.0 lb

## 2020-02-24 DIAGNOSIS — Z0000 Encounter for general adult medical examination without abnormal findings: Secondary | ICD-10-CM

## 2020-02-24 DIAGNOSIS — J302 Other seasonal allergic rhinitis: Secondary | ICD-10-CM

## 2020-02-24 NOTE — Assessment & Plan Note (Signed)
Tetanus status is unknown, will fax employee health. Covid vaccine UTD. Pap smear overdue, she has an appointment with GYN. Colonoscopy due, declines today. Encouraged a healthy diet, regular exercise. Exam today unremarkable.  Labs reviewed.

## 2020-02-24 NOTE — Progress Notes (Signed)
Subjective:    Patient ID: Sarah Chambers, female    DOB: March 25, 1976, 44 y.o.   MRN: [id number]  HPI  This visit occurred during the SARS-CoV-2 public health emergency.  Safety protocols were in place, including screening questions prior to the visit, additional usage of staff PPE, and extensive cleaning of exam room while observing appropriate contact time as indicated for disinfecting solutions.   Sarah Chambers is a 44 year old female who presents today for complete physical.  Immunizations: -Tetanus: Unsure -Influenza: Due this season  -Covid-19: Completed Janssen vaccine    Diet: She endorses a fair diet  Exercise: Exercises regularly   Eye exam: Completes annually  Dental exam: Completes semi-annually   Pap Smear: Scheduled for October Mammogram: Scheduled  Colonoscopy: Declines  Hep C Screen: Negative   BP Readings from Last 3 Encounters:  02/24/20 120/64  04/15/18 104/68  01/16/18 116/72   Wt Readings from Last 3 Encounters:  02/24/20 123 lb (55.8 kg)  11/25/18 121 lb (54.9 kg)  04/15/18 121 lb 12 oz (55.2 kg)     Review of Systems  Constitutional: Negative for unexpected weight change.  HENT: Negative for rhinorrhea.   Eyes: Negative for visual disturbance.  Respiratory: Negative for cough and shortness of breath.   Cardiovascular: Negative for chest pain.  Gastrointestinal: Negative for constipation and diarrhea.  Genitourinary: Negative for difficulty urinating and menstrual problem.  Musculoskeletal: Negative for arthralgias and myalgias.  Skin: Negative for rash.  Allergic/Immunologic: Negative for environmental allergies.  Neurological: Negative for dizziness, numbness and headaches.  Psychiatric/Behavioral: The patient is not nervous/anxious.        Past Medical History:  Diagnosis Date  . History of kidney stones   . Seasonal allergies      Social History   Socioeconomic History  . Marital status: Married    Spouse name: Not on file    . Number of children: Not on file  . Years of education: Not on file  . Highest education level: Not on file  Occupational History  . Not on file  Tobacco Use  . Smoking status: Never Smoker  . Smokeless tobacco: Never Used  Substance and Sexual Activity  . Alcohol use: No    Alcohol/week: 0.0 standard drinks    Comment: socailly   . Drug use: No  . Sexual activity: Yes    Partners: Male  Other Topics Concern  . Not on file  Social History Narrative   ** Merged History Encounter **       Married. 3 children. 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 Enjoys exercising, reading    Social Determinants of Health   Financial Resource Strain:   . Difficulty of Paying Living Expenses: Not on file  Food Insecurity:   . Worried About Programme researcher, broadcasting/film/video in the Last Year: Not on file  . Ran Out of Food in the Last Year: Not on file  Transportation Needs:   . Lack of Transportation (Medical): Not on file  . Lack of Transportation (Non-Medical): Not on file  Physical Activity:   . Days of Exercise per Week: Not on file  . Minutes of Exercise per Session: Not on file  Stress:   . Feeling of Stress : Not on file  Social Connections:   . Frequency of Communication with Friends and Family: Not on file  . Frequency of Social Gatherings with Friends and Family: Not on file  .  Attends Religious Services: Not on file  . Active Member of Clubs or Organizations: Not on file  . Attends Banker Meetings: Not on file  . Marital Status: Not on file  Intimate Partner Violence:   . Fear of Current or Ex-Partner: Not on file  . Emotionally Abused: Not on file  . Physically Abused: Not on file  . Sexually Abused: Not on file    Past Surgical History:  Procedure Laterality Date  . CESAREAN SECTION     1998, [id number]  . CESAREAN SECTION  1998, 2000, 2007  . TUBAL LIGATION      History reviewed. No pertinent family  history.  Allergies  Allergen Reactions  . Amoxicillin Hives    Current Outpatient Medications on File Prior to Visit  Medication Sig Dispense Refill  . cetirizine (ZYRTEC) 10 MG tablet Take 10 mg by mouth daily.    . fluticasone (FLONASE) 50 MCG/ACT nasal spray Place 2 sprays into both nostrils daily. 16 g 6   No current facility-administered medications on file prior to visit.    BP 120/64   Pulse 85   Temp (!) 97.5 F (36.4 C) (Temporal)   Ht 5' 2.5" (1.588 m)   Wt 123 lb (55.8 kg)   SpO2 98%   BMI 22.14 kg/m    Objective:   Physical Exam HENT:     Right Ear: Tympanic membrane and ear canal normal.     Left Ear: Tympanic membrane and ear canal normal.  Eyes:     Pupils: Pupils are equal, round, and reactive to light.  Cardiovascular:     Rate and Rhythm: Normal rate and regular rhythm.  Pulmonary:     Effort: Pulmonary effort is normal.     Breath sounds: Normal breath sounds.  Abdominal:     General: Bowel sounds are normal.     Palpations: Abdomen is soft.     Tenderness: There is no abdominal tenderness.  Musculoskeletal:        General: Normal range of motion.     Cervical back: Neck supple.  Skin:    General: Skin is warm and dry.  Neurological:     Mental Status: She is alert and oriented to person, place, and time.     Cranial Nerves: No cranial nerve deficit.     Deep Tendon Reflexes:     Reflex Scores:      Patellar reflexes are 2+ on the right side and 2+ on the left side. Psychiatric:        Mood and Affect: Mood normal.            Assessment & Plan:

## 2020-02-24 NOTE — Patient Instructions (Addendum)
Continue exercising. You should be getting 150 minutes of moderate intensity exercise weekly.  Continue to work on a healthy diet.  Ensure you are consuming 64 ounces of water daily.  Follow up with gynecology as scheduled.  Set up a lab appointment between 3-6 months for cholesterol check.  It was a pleasure to see you today!   Preventive Care 32-46 Years Old, Female Preventive care refers to visits with your health care provider and lifestyle choices that can promote health and wellness. This includes:  A yearly physical exam. This may also be called an annual well check.  Regular dental visits and eye exams.  Immunizations.  Screening for certain conditions.  Healthy lifestyle choices, such as eating a healthy diet, getting regular exercise, not using drugs or products that contain nicotine and tobacco, and limiting alcohol use. What can I expect for my preventive care visit? Physical exam Your health care provider will check your:  Height and weight. This may be used to calculate body mass index (BMI), which tells if you are at a healthy weight.  Heart rate and blood pressure.  Skin for abnormal spots. Counseling Your health care provider may ask you questions about your:  Alcohol, tobacco, and drug use.  Emotional well-being.  Home and relationship well-being.  Sexual activity.  Eating habits.  Work and work Statistician.  Method of birth control.  Menstrual cycle.  Pregnancy history. What immunizations do I need?  Influenza (flu) vaccine  This is recommended every year. Tetanus, diphtheria, and pertussis (Tdap) vaccine  You may need a Td booster every 10 years. Varicella (chickenpox) vaccine  You may need this if you have not been vaccinated. Zoster (shingles) vaccine  You may need this after age 8. Measles, mumps, and rubella (MMR) vaccine  You may need at least one dose of MMR if you were born in 1957 or later. You may also need a second  dose. Pneumococcal conjugate (PCV13) vaccine  You may need this if you have certain conditions and were not previously vaccinated. Pneumococcal polysaccharide (PPSV23) vaccine  You may need one or two doses if you smoke cigarettes or if you have certain conditions. Meningococcal conjugate (MenACWY) vaccine  You may need this if you have certain conditions. Hepatitis A vaccine  You may need this if you have certain conditions or if you travel or work in places where you may be exposed to hepatitis A. Hepatitis B vaccine  You may need this if you have certain conditions or if you travel or work in places where you may be exposed to hepatitis B. Haemophilus influenzae type b (Hib) vaccine  You may need this if you have certain conditions. Human papillomavirus (HPV) vaccine  If recommended by your health care provider, you may need three doses over 6 months. You may receive vaccines as individual doses or as more than one vaccine together in one shot (combination vaccines). Talk with your health care provider about the risks and benefits of combination vaccines. What tests do I need? Blood tests  Lipid and cholesterol levels. These may be checked every 5 years, or more frequently if you are over 62 years old.  Hepatitis C test.  Hepatitis B test. Screening  Lung cancer screening. You may have this screening every year starting at age 9 if you have a 30-pack-year history of smoking and currently smoke or have quit within the past 15 years.  Colorectal cancer screening. All adults should have this screening starting at age 73 and continuing  until age 8. Your health care provider may recommend screening at age 44 if you are at increased risk. You will have tests every 1-10 years, depending on your results and the type of screening test.  Diabetes screening. This is done by checking your blood sugar (glucose) after you have not eaten for a while (fasting). You may have this done every  1-3 years.  Mammogram. This may be done every 1-2 years. Talk with your health care provider about when you should start having regular mammograms. This may depend on whether you have a family history of breast cancer.  BRCA-related cancer screening. This may be done if you have a family history of breast, ovarian, tubal, or peritoneal cancers.  Pelvic exam and Pap test. This may be done every 3 years starting at age 35. Starting at age 30, this may be done every 5 years if you have a Pap test in combination with an HPV test. Other tests  Sexually transmitted disease (STD) testing.  Bone density scan. This is done to screen for osteoporosis. You may have this scan if you are at high risk for osteoporosis. Follow these instructions at home: Eating and drinking  Eat a diet that includes fresh fruits and vegetables, whole grains, lean protein, and low-fat dairy.  Take vitamin and mineral supplements as recommended by your health care provider.  Do not drink alcohol if: ? Your health care provider tells you not to drink. ? You are pregnant, may be pregnant, or are planning to become pregnant.  If you drink alcohol: ? Limit how much you have to 0-1 drink a day. ? Be aware of how much alcohol is in your drink. In the [place] one drink equals one 12 oz bottle of beer (355 mL), one 5 oz glass of wine (148 mL), or one 1 oz glass of hard liquor (44 mL). Lifestyle  Take daily care of your teeth and gums.  Stay active. Exercise for at least 30 minutes on 5 or more days each week.  Do not use any products that contain nicotine or tobacco, such as cigarettes, e-cigarettes, and chewing tobacco. If you need help quitting, ask your health care provider.  If you are sexually active, practice safe sex. Use a condom or other form of birth control (contraception) in order to prevent pregnancy and STIs (sexually transmitted infections).  If told by your health care provider, take low-dose aspirin daily  starting at age 103. What's next?  Visit your health care provider once a year for a well check visit.  Ask your health care provider how often you should have your eyes and teeth checked.  Stay up to date on all vaccines. This information is not intended to replace advice given to you by your health care provider. Make sure you discuss any questions you have with your health care provider. Document Revised: 01/31/2018 Document Reviewed: 01/31/2018 Elsevier Patient Education  2020 [employer].

## 2020-02-24 NOTE — Assessment & Plan Note (Signed)
Well controlled on current regimen, continue same.  

## 2020-03-08 ENCOUNTER — Other Ambulatory Visit: Payer: Self-pay

## 2020-03-08 ENCOUNTER — Ambulatory Visit (INDEPENDENT_AMBULATORY_CARE_PROVIDER_SITE_OTHER): Payer: No Typology Code available for payment source | Admitting: Obstetrics and Gynecology

## 2020-03-08 ENCOUNTER — Other Ambulatory Visit (HOSPITAL_COMMUNITY)
Admission: RE | Admit: 2020-03-08 | Discharge: 2020-03-08 | Disposition: A | Discharge status: Home / Self Care | Payer: No Typology Code available for payment source | Source: Ambulatory Visit | Attending: Obstetrics and Gynecology | Admitting: Obstetrics and Gynecology

## 2020-03-08 ENCOUNTER — Encounter: Payer: Self-pay | Admitting: Obstetrics and Gynecology

## 2020-03-08 VITALS — BP 110/68 | Ht 62.5 in | Wt 125.0 lb

## 2020-03-08 DIAGNOSIS — Z01419 Encounter for gynecological examination (general) (routine) without abnormal findings: Secondary | ICD-10-CM

## 2020-03-08 DIAGNOSIS — Z124 Encounter for screening for malignant neoplasm of cervix: Secondary | ICD-10-CM

## 2020-03-08 DIAGNOSIS — Z0000 Encounter for general adult medical examination without abnormal findings: Secondary | ICD-10-CM | POA: Diagnosis not present

## 2020-03-08 NOTE — Progress Notes (Signed)
Gynecology Annual Exam  PCP: Doreene Nest, NP  Chief Complaint:  Chief Complaint  Patient presents with  . Gynecologic Exam    History of Present Illness: Patient is a 44 y.o. [id number] presents for annual exam. The patient has no complaints today.   LMP: Patient's last menstrual period was 02/07/2020. Average Interval: regular, 28 days Duration of flow: 4 days Heavy Menses: no Clots: no Intermenstrual Bleeding: no Postcoital Bleeding: no Dysmenorrhea: no  The patient is sexually active. She currently uses tubal ligation for contraception. She denies dyspareunia.  The patient does not perform self breast exams.  There is no notable family history of breast or ovarian cancer in her family.  The patient has regular exercise: yes, spinning daily.    The patient reports current symptoms of depression.    Patient reports frequent urination.  She reports that she urinates every 45 minutes and will wake up on average 4 times a night to pee as well she notes that she additionally has symptoms of stress incontinence reports leakage of urine with cough laugh and sneeze.  She denies symptoms of urge incontinence.    Review of Systems: Review of Systems  Constitutional: Negative for chills, fever, malaise/fatigue and weight loss.  HENT: Negative for congestion, hearing loss and sinus pain.   Eyes: Negative for blurred vision and double vision.  Respiratory: Negative for cough, sputum production, shortness of breath and wheezing.   Cardiovascular: Negative for chest pain, palpitations, orthopnea and leg swelling.  Gastrointestinal: Negative for abdominal pain, constipation, diarrhea, nausea and vomiting.  Genitourinary: Positive for frequency. Negative for dysuria, flank pain, hematuria and urgency.  Musculoskeletal: Negative for back pain, falls and joint pain.  Skin: Negative for itching and rash.  Neurological: Negative for dizziness and headaches.  Psychiatric/Behavioral:  Negative for depression, substance abuse and suicidal ideas. The patient is not nervous/anxious.     Past Medical History:  Patient Active Problem List   Diagnosis Date Noted  . Night sweats 11/25/2018  . Preventative health care 07/02/2017  . Seasonal allergies 02/25/2015    Past Surgical History:  Past Surgical History:  Procedure Laterality Date  . CESAREAN SECTION     1998, [id number]  . CESAREAN SECTION  1998, 2000, 2007  . TUBAL LIGATION      Gynecologic History:  Patient's last menstrual period was 02/07/2020. Contraception: tubal ligation Last Pap: Results were: unknown  Last mammogram: 2019 Results were: BI-RAD I  Obstetric History: [id number]  Family History:  History reviewed. No pertinent family history.  Social History:  Social History   Socioeconomic History  . Marital status: Married    Spouse name: Not on file  . Number of children: Not on file  . Years of education: Not on file  . Highest education level: Not on file  Occupational History  . Not on file  Tobacco Use  . Smoking status: Never Smoker  . Smokeless tobacco: Never Used  Substance and Sexual Activity  . Alcohol use: No    Alcohol/week: 0.0 standard drinks    Comment: socailly   . Drug use: No  . Sexual activity: Yes    Partners: Male  Other Topics Concern  . Not on file  Social History Narrative   ** Merged History Encounter **       Married. 3 children. 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 Enjoys exercising, reading    Social Determinants of Health  Financial Resource Strain:   . Difficulty of Paying Living Expenses: Not on file  Food Insecurity:   . Worried About Programme researcher, broadcasting/film/video in the Last Year: Not on file  . Ran Out of Food in the Last Year: Not on file  Transportation Needs:   . Lack of Transportation (Medical): Not on file  . Lack of Transportation (Non-Medical): Not on file  Physical Activity:   . Days  of Exercise per Week: Not on file  . Minutes of Exercise per Session: Not on file  Stress:   . Feeling of Stress : Not on file  Social Connections:   . Frequency of Communication with Friends and Family: Not on file  . Frequency of Social Gatherings with Friends and Family: Not on file  . Attends Religious Services: Not on file  . Active Member of Clubs or Organizations: Not on file  . Attends Banker Meetings: Not on file  . Marital Status: Not on file  Intimate Partner Violence:   . Fear of Current or Ex-Partner: Not on file  . Emotionally Abused: Not on file  . Physically Abused: Not on file  . Sexually Abused: Not on file    Allergies:  Allergies  Allergen Reactions  . Amoxicillin Hives    Medications: Prior to Admission medications   Medication Sig Start Date End Date Taking? Authorizing Provider  cetirizine (ZYRTEC) 10 MG tablet Take 10 mg by mouth daily.   Yes [provider]  fluticasone (FLONASE) 50 MCG/ACT nasal spray Place 2 sprays into both nostrils daily. Patient not taking: Reported on 03/08/2020 01/16/18   Emi Belfast, FNP    Physical Exam Vitals: Blood pressure 110/68, height 5' 2.5" (1.588 m), weight 125 lb (56.7 kg), last menstrual period 02/07/2020.  General: NAD HEENT: normocephalic, anicteric Thyroid: no enlargement, no palpable nodules Pulmonary: No increased work of breathing, CTAB Cardiovascular: RRR, distal pulses 2+ Breast: Breast symmetrical, no tenderness, no palpable nodules or masses, no skin or nipple retraction present, no nipple discharge.  No axillary or supraclavicular lymphadenopathy. Abdomen: NABS, soft, non-tender, non-distended.  Umbilicus without lesions.  No hepatomegaly, splenomegaly or masses palpable. No evidence of hernia  Genitourinary:  External: Normal external female genitalia.  Normal urethral meatus, normal Bartholin's and Skene's glands.    Vagina: Normal vaginal mucosa, no evidence of prolapse.     Cervix: Grossly normal in appearance, no bleeding  Uterus: Non-enlarged, mobile, normal contour.  No CMT  Adnexa: ovaries non-enlarged, no adnexal masses  Rectal: deferred  Lymphatic: no evidence of inguinal lymphadenopathy Extremities: no edema, erythema, or tenderness Neurologic: Grossly intact Psychiatric: mood appropriate, affect full  Female chaperone present for pelvic and breast  portions of the physical exam    Assessment: 44 y.o. G3P3003 routine annual exam  Plan: Problem List Items Addressed This Visit    None    Visit Diagnoses    Encounter for annual routine gynecological examination    -  Primary   Health maintenance examination       Cervical cancer screening       Relevant Orders   Cytology, thin prep pap (cervical)   Encounter for gynecological examination without abnormal finding          1) Mammogram - recommend yearly screening mammogram.  Mammogram is scheduled for November 2021  2) STI screening  was offered and declined  3) ASCCP guidelines and rational discussed.  Patient opts for every 5 years screening interval  4) Contraception -  the patient is currently using  tubal ligation.  She is happy with her current form of contraception and plans to continue  5) Colon cancer screening discussed. Colonoscopy or Cologuard encouraged and discussed. Patient declines colon cancer screening today.  6) Routine healthcare maintenance including cholesterol, diabetes screening discussed managed by PCP  7) Urinary frequency- discussed options for treatment and provided AUGS handout. Stress incontinence and urinary frequency.  Declines urology referral at this time. She will consider treatment options and follow up to discuss further.   8) Return in about 1 year (around 03/08/2021) for annual.   Jerolyn [facility], [facility] 03/08/2020, 10:14 AM

## 2020-03-08 NOTE — Patient Instructions (Signed)
Institute of Medicine Recommended Dietary Allowances for Calcium and Vitamin D  Age (yr) Calcium Recommended Dietary Allowance (mg/day) Vitamin D Recommended Dietary Allowance (international units/day)  9-18 1,300 600  19-50 1,000 600  51-70 1,200 600  71 and older 1,200 800  Data from Institute of Medicine. Dietary reference intakes: calcium, vitamin D. [place], [place]: National Academies Press; 2011.     Exercising to Stay Healthy To become healthy and stay healthy, it is recommended that you do moderate-intensity and vigorous-intensity exercise. You can tell that you are exercising at a moderate intensity if your heart starts beating faster and you start breathing faster but can still hold a conversation. You can tell that you are exercising at a vigorous intensity if you are breathing much harder and faster and cannot hold a conversation while exercising. Exercising regularly is important. It has many health benefits, such as:  Improving overall fitness, flexibility, and endurance.  Increasing bone density.  Helping with weight control.  Decreasing body fat.  Increasing muscle strength.  Reducing stress and tension.  Improving overall health. How often should I exercise? Choose an activity that you enjoy, and set realistic goals. Your health care provider can help you make an activity plan that works for you. Exercise regularly as told by your health care provider. This may include:  Doing strength training two times a week, such as: ? Lifting weights. ? Using resistance bands. ? Push-ups. ? Sit-ups. ? Yoga.  Doing a certain intensity of exercise for a given amount of time. Choose from these options: ? A total of 150 minutes of moderate-intensity exercise every week. ? A total of 75 minutes of vigorous-intensity exercise every week. ? A mix of moderate-intensity and vigorous-intensity exercise every week. Children, pregnant women, people who have not exercised  regularly, people who are overweight, and older adults may need to talk with a health care provider about what activities are safe to do. If you have a medical condition, be sure to talk with your health care provider before you start a new exercise program. What are some exercise ideas? Moderate-intensity exercise ideas include:  Walking 1 mile (1.6 km) in about 15 minutes.  Biking.  Hiking.  Golfing.  Dancing.  Water aerobics. Vigorous-intensity exercise ideas include:  Walking 4.5 miles (7.2 km) or more in about 1 hour.  Jogging or running 5 miles (8 km) in about 1 hour.  Biking 10 miles (16.1 km) or more in about 1 hour.  Lap swimming.  Roller-skating or in-line skating.  Cross-country skiing.  Vigorous competitive sports, such as football, basketball, and soccer.  Jumping rope.  Aerobic dancing. What are some everyday activities that can help me to get exercise?  Yard work, such as: ? Pushing a lawn mower. ? Raking and bagging leaves.  Washing your car.  Pushing a stroller.  Shoveling snow.  Gardening.  Washing windows or floors. How can I be more active in my day-to-day activities?  Use stairs instead of an elevator.  Take a walk during your lunch break.  If you drive, park your car farther away from your work or school.  If you take public transportation, get off one stop early and walk the rest of the way.  Stand up or walk around during all of your indoor phone calls.  Get up, stretch, and walk around every 30 minutes throughout the day.  Enjoy exercise with a friend. Support to continue exercising will help you keep a regular routine of activity. What guidelines can   I follow while exercising?  Before you start a new exercise program, talk with your health care provider.  Do not exercise so much that you hurt yourself, feel dizzy, or get very short of breath.  Wear comfortable clothes and wear shoes with good support.  Drink plenty of  water while you exercise to prevent dehydration or heat stroke.  Work out until your breathing and your heartbeat get faster. Where to find more information  [place].S[place] Department of Health and Human Services: www.hhs.gov  Centers for Disease Control and Prevention (CDC): www.cdc.gov Summary  Exercising regularly is important. It will improve your overall fitness, flexibility, and endurance.  Regular exercise also will improve your overall health. It can help you control your weight, reduce stress, and improve your bone density.  Do not exercise so much that you hurt yourself, feel dizzy, or get very short of breath.  Before you start a new exercise program, talk with your health care provider. This information is not intended to replace advice given to you by your health care provider. Make sure you discuss any questions you have with your health care provider. Document Revised: 05/04/2017 Document Reviewed: 04/12/2017 Elsevier Patient Education  2020 Elsevier Inc.   Budget-Friendly Healthy Eating There are many ways to save money at the grocery store and continue to eat healthy. You can be successful if you:  Plan meals according to your budget.  Make a grocery list and only purchase food according to your grocery list.  Prepare food yourself. What are tips for following this plan?  Reading food labels  Compare food labels between brand name foods and the store brand. Often the nutritional value is the same, but the store brand is lower cost.  Look for products that do not have added sugar, fat, or salt (sodium). These often cost the same but are healthier for you. Products may be labeled as: ? Sugar-free. ? Nonfat. ? Low-fat. ? Sodium-free. ? Low-sodium.  Look for lean ground beef labeled as at least 92% lean and 8% fat. Shopping  Buy only the items on your grocery list and go only to the areas of the store that have the items on your list.  Use coupons only for foods  and brands you normally buy. Avoid buying items you wouldn't normally buy simply because they are on sale.  Check online and in newspapers for weekly deals.  Buy healthy items from the bulk bins when available, such as herbs, spices, flour, pasta, nuts, and dried fruit.  Buy fruits and vegetables that are in season. Prices are usually lower on in-season produce.  Look at the unit price on the price tag. Use it to compare different brands and sizes to find out which item is the best deal.  Choose healthy items that are often low-cost, such as carrots, potatoes, apples, bananas, and oranges. Dried or canned beans are a low-cost protein source.  Buy in bulk and freeze extra food. Items you can buy in bulk include meats, fish, poultry, frozen fruits, and frozen vegetables.  Avoid buying "ready-to-eat" foods, such as pre-cut fruits and vegetables and pre-made salads.  If possible, shop around to discover where you can find the best prices. Consider other retailers such as dollar stores, larger wholesale stores, local fruit and vegetable stands, and farmers markets.  Do not shop when you are hungry. If you shop while hungry, it may be hard to stick to your list and budget.  Resist impulse buying. Use your grocery list as   your official plan for the week.  Buy a variety of vegetables and fruits by purchasing fresh, frozen, and canned items.  Look at the top and bottom shelves for deals. Foods at eye level (eye level of an adult or child) are usually more expensive.  Be efficient with your time when shopping. The more time you spend at the store, the more money you are likely to spend.  To save money when choosing more expensive foods like meats and dairy: ? Choose cheaper cuts of meat, such as bone-in chicken thighs and drumsticks instead of skinless and boneless chicken. When you are ready to prepare the chicken, you can remove the skin yourself to make it healthier. ? Choose lean meats like  chicken or turkey instead of beef. ? Choose canned seafood, such as tuna, salmon, or sardines. ? Buy eggs as a low-cost source of protein. ? Buy dried beans and peas, such as lentils, split peas, or kidney beans instead of meats. Dried beans and peas are a good alternative source of protein. ? Buy the larger tubs of yogurt instead of individual-sized containers.  Choose water instead of sodas and other sweetened beverages.  Avoid buying chips, cookies, and other "junk food." These items are usually expensive and not healthy. Cooking  Make extra food and freeze the extras in meal-sized containers or in individual portions for fast meals and snacks.  Pre-cook on days when you have extra time to prepare meals in advance. You can keep these meals in the fridge or freezer and reheat for a quick meal.  When you come home from the grocery store, wash, peel, and cut fruits and vegetables so they are ready to use and eat. This will help reduce food waste. Meal planning  Do not eat out or get fast food. Prepare food at home.  Make a grocery list and make sure to bring it with you to the store. If you have a smart phone, you could use your phone to create your shopping list.  Plan meals and snacks according to a grocery list and budget you create.  Use leftovers in your meal plan for the week.  Look for recipes where you can cook once and make enough food for two meals.  Include budget-friendly meals like stews, casseroles, and stir-fry dishes.  Try some meatless meals or try "no cook" meals like salads.  Make sure that half your plate is filled with fruits or vegetables. Choose from fresh, frozen, or canned fruits and vegetables. If eating canned, remember to rinse them before eating. This will remove any excess salt added for packaging. Summary  Eating healthy on a budget is possible if you plan your meals according to your budget, purchase according to your budget and grocery list, and  prepare food yourself.  Tips for buying more food on a limited budget include buying generic brands, using coupons only for foods you normally buy, and buying healthy items from the bulk bins when available.  Tips for buying cheaper food to replace expensive food include choosing cheaper, lean cuts of meat, and buying dried beans and peas. This information is not intended to replace advice given to you by your health care provider. Make sure you discuss any questions you have with your health care provider. Document Revised: 05/23/2017 Document Reviewed: 05/23/2017 Elsevier Patient Education  2020 Elsevier Inc.   Bone Health Bones protect organs, store calcium, anchor muscles, and support the whole body. Keeping your bones strong is important, especially as you   get older. You can take actions to help keep your bones strong and healthy. Why is keeping my bones healthy important?  Keeping your bones healthy is important because your body constantly replaces bone cells. Cells get old, and new cells take their place. As we age, we lose bone cells because the body may not be able to make enough new cells to replace the old cells. The amount of bone cells and bone tissue you have is referred to as bone mass. The higher your bone mass, the stronger your bones. The aging process leads to an overall loss of bone mass in the body, which can increase the likelihood of:  Joint pain and stiffness.  Broken bones.  A condition in which the bones become weak and brittle (osteoporosis). A large decline in bone mass occurs in older adults. In women, it occurs about the time of menopause. What actions can I take to keep my bones healthy? Good health habits are important for maintaining healthy bones. This includes eating nutritious foods and exercising regularly. To have healthy bones, you need to get enough of the right minerals and vitamins. Most nutrition experts recommend getting these nutrients from the  foods that you eat. In some cases, taking supplements may also be recommended. Doing certain types of exercise is also important for bone health. What are the nutritional recommendations for healthy bones?  Eating a well-balanced diet with plenty of calcium and vitamin D will help to protect your bones. Nutritional recommendations vary from person to person. Ask your health care provider what is healthy for you. Here are some general guidelines. Get enough calcium Calcium is the most important (essential) mineral for bone health. Most people can get enough calcium from their diet, but supplements may be recommended for people who are at risk for osteoporosis. Good sources of calcium include:  Dairy products, such as low-fat or nonfat milk, cheese, and yogurt.  Dark green leafy vegetables, such as bok choy and broccoli.  Calcium-fortified foods, such as orange juice, cereal, bread, soy beverages, and tofu products.  Nuts, such as almonds. Follow these recommended amounts for daily calcium intake:  Children, age 1-3: 700 mg.  Children, age 4-8: 1,000 mg.  Children, age 9-13: 1,300 mg.  Teens, age 14-18: 1,300 mg.  Adults, age 19-50: 1,000 mg.  Adults, age 51-70: ? Men: 1,000 mg. ? Women: 1,200 mg.  Adults, age 71 or older: 1,200 mg.  Pregnant and breastfeeding females: ? Teens: 1,300 mg. ? Adults: 1,000 mg. Get enough vitamin D Vitamin D is the most essential vitamin for bone health. It helps the body absorb calcium. Sunlight stimulates the skin to make vitamin D, so be sure to get enough sunlight. If you live in a cold climate or you do not get outside often, your health care provider may recommend that you take vitamin D supplements. Good sources of vitamin D in your diet include:  Egg yolks.  Saltwater fish.  Milk and cereal fortified with vitamin D. Follow these recommended amounts for daily vitamin D intake:  Children and teens, age 1-18: 600 international  units.  Adults, age 50 or younger: 400-800 international units.  Adults, age 51 or older: 800-1,000 international units. Get other important nutrients Other nutrients that are important for bone health include:  Phosphorus. This mineral is found in meat, poultry, dairy foods, nuts, and legumes. The recommended daily intake for adult men and adult women is 700 mg.  Magnesium. This mineral is found in seeds, nuts, dark   green vegetables, and legumes. The recommended daily intake for adult men is 400-420 mg. For adult women, it is 310-320 mg.  Vitamin K. This vitamin is found in green leafy vegetables. The recommended daily intake is 120 mg for adult men and 90 mg for adult women. What type of physical activity is best for building and maintaining healthy bones? Weight-bearing and strength-building activities are important for building and maintaining healthy bones. Weight-bearing activities cause muscles and bones to work against gravity. Strength-building activities increase the strength of the muscles that support bones. Weight-bearing and muscle-building activities include:  Walking and hiking.  Jogging and running.  Dancing.  Gym exercises.  Lifting weights.  Tennis and racquetball.  Climbing stairs.  Aerobics. Adults should get at least 30 minutes of moderate physical activity on most days. Children should get at least 60 minutes of moderate physical activity on most days. Ask your health care provider what type of exercise is best for you. How can I find out if my bone mass is low? Bone mass can be measured with an X-ray test called a bone mineral density (BMD) test. This test is recommended for all women who are age 65 or older. It may also be recommended for:  Men who are age 70 or older.  People who are at risk for osteoporosis because of: ? Having bones that break easily. ? Having a long-term disease that weakens bones, such as kidney disease or rheumatoid  arthritis. ? Having menopause earlier than normal. ? Taking medicine that weakens bones, such as steroids, thyroid hormones, or hormone treatment for breast cancer or prostate cancer. ? Smoking. ? Drinking three or more alcoholic drinks a day. If you find that you have a low bone mass, you may be able to prevent osteoporosis or further bone loss by changing your diet and lifestyle. Where can I find more information? For more information, check out the following websites:  National Osteoporosis Foundation: www.nof.org/patients  National Institutes of Health: www.bones.nih.gov  International Osteoporosis Foundation: www.iofbonehealth.org Summary  The aging process leads to an overall loss of bone mass in the body, which can increase the likelihood of broken bones and osteoporosis.  Eating a well-balanced diet with plenty of calcium and vitamin D will help to protect your bones.  Weight-bearing and strength-building activities are also important for building and maintaining strong bones.  Bone mass can be measured with an X-ray test called a bone mineral density (BMD) test. This information is not intended to replace advice given to you by your health care provider. Make sure you discuss any questions you have with your health care provider. Document Revised: 06/18/2017 Document Reviewed: 06/18/2017 Elsevier Patient Education  2020 Elsevier Inc.   

## 2020-03-10 LAB — CYTOLOGY - PAP
Comment: NEGATIVE
Diagnosis: NEGATIVE
High risk HPV: NEGATIVE

## 2020-03-15 ENCOUNTER — Other Ambulatory Visit: Payer: Self-pay

## 2020-03-15 ENCOUNTER — Ambulatory Visit
Admission: RE | Admit: 2020-03-15 | Discharge: 2020-03-15 | Disposition: A | Discharge status: Home / Self Care | Payer: No Typology Code available for payment source | Source: Ambulatory Visit | Attending: Primary Care | Admitting: Primary Care

## 2020-03-15 DIAGNOSIS — Z1231 Encounter for screening mammogram for malignant neoplasm of breast: Secondary | ICD-10-CM

## 2020-03-18 ENCOUNTER — Other Ambulatory Visit: Payer: Self-pay | Admitting: Primary Care

## 2020-03-18 DIAGNOSIS — R928 Other abnormal and inconclusive findings on diagnostic imaging of breast: Secondary | ICD-10-CM

## 2020-03-22 ENCOUNTER — Other Ambulatory Visit: Payer: Self-pay

## 2020-03-22 ENCOUNTER — Ambulatory Visit
Admission: RE | Admit: 2020-03-22 | Discharge: 2020-03-22 | Disposition: A | Discharge status: Home / Self Care | Payer: No Typology Code available for payment source | Source: Ambulatory Visit | Attending: Primary Care | Admitting: Primary Care

## 2020-03-22 ENCOUNTER — Ambulatory Visit: Payer: No Typology Code available for payment source

## 2020-03-22 DIAGNOSIS — R928 Other abnormal and inconclusive findings on diagnostic imaging of breast: Secondary | ICD-10-CM

## 2020-03-31 ENCOUNTER — Other Ambulatory Visit: Payer: No Typology Code available for payment source

## 2020-09-06 ENCOUNTER — Other Ambulatory Visit: Payer: Self-pay

## 2020-09-06 ENCOUNTER — Encounter: Payer: Self-pay | Admitting: Internal Medicine

## 2020-09-06 ENCOUNTER — Telehealth (INDEPENDENT_AMBULATORY_CARE_PROVIDER_SITE_OTHER): Payer: No Typology Code available for payment source | Admitting: Internal Medicine

## 2020-09-06 DIAGNOSIS — J0110 Acute frontal sinusitis, unspecified: Secondary | ICD-10-CM | POA: Diagnosis not present

## 2020-09-06 MED ORDER — AZITHROMYCIN 250 MG PO TABS: ORAL_TABLET | ORAL | 0 refills | Status: DC

## 2020-09-06 MED ORDER — PREDNISONE 10 MG PO TABS
ORAL_TABLET | ORAL | 0 refills | Status: DC
  Filled 2020-09-06: qty 21, 6d supply, fill #0

## 2020-09-06 MED ORDER — HYDROCODONE-HOMATROPINE 5-1.5 MG/5ML PO SYRP: 5.0000 mL | ORAL_SOLUTION | Freq: Three times a day (TID) | ORAL | 0 refills | Status: DC | PRN

## 2020-09-06 MED ORDER — HYDROCODONE-HOMATROPINE 5-1.5 MG/5ML PO SYRP
5.0000 mL | ORAL_SOLUTION | Freq: Three times a day (TID) | ORAL | 0 refills | Status: DC | PRN
Start: 2020-09-06 — End: 2022-05-04
  Filled 2020-09-06: qty 120, 8d supply, fill #0

## 2020-09-06 MED ORDER — AZITHROMYCIN 250 MG PO TABS
ORAL_TABLET | ORAL | 0 refills | Status: DC
  Filled 2020-09-06: qty 6, 5d supply, fill #0

## 2020-09-06 MED ORDER — PREDNISONE 10 MG PO TABS: ORAL_TABLET | ORAL | 0 refills | Status: DC

## 2020-09-06 NOTE — Telephone Encounter (Signed)
Pam pharmacist with [facility] outpt pharmacy said that they have not received the Zithromax, prednisone or Hydrocodone homatropine (appears per rxs that they were printed and not sent electronically. Pam ask to send rxs electronically).

## 2020-09-06 NOTE — Patient Instructions (Signed)

## 2020-09-06 NOTE — Progress Notes (Signed)
Virtual Visit via Video Note  I connected with Sarah Chambers on 09/06/20 at  9:15 AM EDT by a video enabled telemedicine application and verified that I am speaking with the correct person using two identifiers.  Location: Patient: Home Provider: Office  Person's participating in this video call: Nicki Reaper, NP-C and Shatonya Shiffler   I discussed the limitations of evaluation and management by telemedicine and the availability of in person appointments. The patient expressed understanding and agreed to proceed.  History of Present Illness:  Pt reports headache, nasal congestion, ear pain, sore throat and cough. This started 2 weeks ago. The headache is located all over her head. She describes the pain as throbbing, pressure. She denies dizziness, vision changes, sensitivity to light or sound. She describes the ear pain as full/pressure. She denies drainage or loss of hearing. She is blowing clear/yellow mucous out of her nose. The cough is mostly non productive. She is intermittently short of breath. She has had chills, but denies fever or body aches. She had a negative Covid test yesterday. She has had [employer]. She has had sick contacts with similar symptoms. She does not smoke. She does have a history of seasonal allergies and is prone to sinus infections.    Past Medical History:  Diagnosis Date  . History of kidney stones   . Seasonal allergies     Current Outpatient Medications  Medication Sig Dispense Refill  . cetirizine (ZYRTEC) 10 MG tablet Take 10 mg by mouth daily.    . fluticasone (FLONASE) 50 MCG/ACT nasal spray Place 2 sprays into both nostrils daily. (Patient not taking: Reported on 03/08/2020) 16 g 6   No current facility-administered medications for this visit.    Allergies  Allergen Reactions  . Amoxicillin Hives    No family history on file.  Social History   Socioeconomic History  . Marital status: Married    Spouse name: Not on file  . Number  of children: Not on file  . Years of education: Not on file  . Highest education level: Not on file  Occupational History  . Not on file  Tobacco Use  . Smoking status: Never Smoker  . Smokeless tobacco: Never Used  Substance and Sexual Activity  . Alcohol use: No    Alcohol/week: 0.0 standard drinks    Comment: socailly   . Drug use: No  . Sexual activity: Yes    Partners: Male  Other Topics Concern  . Not on file  Social History Narrative   ** Merged History Encounter **       Married. 3 children. 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 Enjoys exercising, reading    Social Determinants of Health   Financial Resource Strain: Not on file  Food Insecurity: Not on file  Transportation Needs: Not on file  Physical Activity: Not on file  Stress: Not on file  Social Connections: Not on file  Intimate Partner Violence: Not on file     Constitutional: Pt reports headache, chills. Denies fever, malaise, fatigue, or abrupt weight changes.  HEENT: Pt reports nasal congestion, ear pain and sore throat. Denies eye pain, eye redness, ringing in the ears, wax buildup, runny nose, bloody nose. Respiratory: Pt reports cough, intermittent shortness of breath. Denies difficulty breathing, or sputum production.   Cardiovascular: Denies chest pain, chest tightness, palpitations or swelling in the hands or feet.  Neurological: Denies dizziness, difficulty with memory, difficulty with  speech or problems with balance and coordination.    No other specific complaints in a complete review of systems (except as listed in HPI above).  Observations/Objective:  Wt Readings from Last 3 Encounters:  03/08/20 125 lb (56.7 kg)  02/24/20 123 lb (55.8 kg)  11/25/18 121 lb (54.9 kg)    General: Appears her stated age, well developed, well nourished in NAD. HEENT: Head: normal shape and size, frontal sinus pressure per pt report;  Nose:  congestion noted; Throat/Mouth: Redness and swelling per pt report. No exudate noted per pt. Hoarseness noted. Pulmonary/Chest: Normal effort. No respiratory distress.  Neurological: Alert and oriented.   BMET    Component Value Date/Time   NA 138 02/17/2020 0916   K 3.7 02/17/2020 0916   CL 101 02/17/2020 0916   CO2 29 02/17/2020 0916   GLUCOSE 87 02/17/2020 0916   BUN 11 02/17/2020 0916   CREATININE 0.73 02/17/2020 0916   CREATININE 0.73 02/17/2015 1604   CALCIUM 9.2 02/17/2020 0916    Lipid Panel     Component Value Date/Time   CHOL 216 (H) 02/17/2020 0916   TRIG 69.0 02/17/2020 0916   HDL 72.80 02/17/2020 0916   CHOLHDL 3 02/17/2020 0916   VLDL 13.8 02/17/2020 0916   LDLCALC 130 (H) 02/17/2020 0916    CBC    Component Value Date/Time   WBC 4.5 02/17/2020 0916   RBC 4.01 02/17/2020 0916   HGB 13.1 02/17/2020 0916   HCT 38.8 02/17/2020 0916   PLT 213.0 02/17/2020 0916   MCV 96.7 02/17/2020 0916   MCH 30.4 02/17/2015 1604   MCHC 33.8 02/17/2020 0916   RDW 12.9 02/17/2020 0916    Hgb A1C No results found for: HGBA1C      Assessment and Plan:  Acute Frontal Sinusitis:  RX for Pred Taper x 6 days RX for Azithromycin 250 mg PO x 5 days Continue allergy meds as previous precribed RX for Hycodan for cough  Return precautions discussed  Follow Up Instructions:    I discussed the assessment and treatment plan with the patient. The patient was provided an opportunity to ask questions and all were answered. The patient agreed with the plan and demonstrated an understanding of the instructions.   The patient was advised to call back or seek an in-person evaluation if the symptoms worsen or if the condition fails to improve as anticipated.     Nicki Reaper, NP

## 2020-10-29 ENCOUNTER — Other Ambulatory Visit (HOSPITAL_COMMUNITY): Payer: Self-pay

## 2020-10-29 MED ORDER — VALACYCLOVIR HCL 1 G PO TABS
1000.0000 mg | ORAL_TABLET | Freq: Three times a day (TID) | ORAL | 1 refills | Status: DC
  Filled 2020-10-29: qty 42, 14d supply, fill #0

## 2020-10-29 MED ORDER — GATIFLOXACIN 0.5 % OP SOLN
1.0000 [drp] | Freq: Four times a day (QID) | OPHTHALMIC | 1 refills | Status: DC
Start: 2020-10-29 — End: 2022-05-04
  Filled 2020-10-29: qty 2.5, 13d supply, fill #0

## 2021-05-06 IMAGING — MG DIGITAL SCREENING BILAT W/ TOMO W/ CAD
8 series · 9 of 24 positions shown · non-contrast
Comparison: Previous exam(s).

CLINICAL DATA: Screening.

EXAM:
DIGITAL SCREENING BILATERAL MAMMOGRAM WITH TOMO AND CAD

[R CC synth-2D]
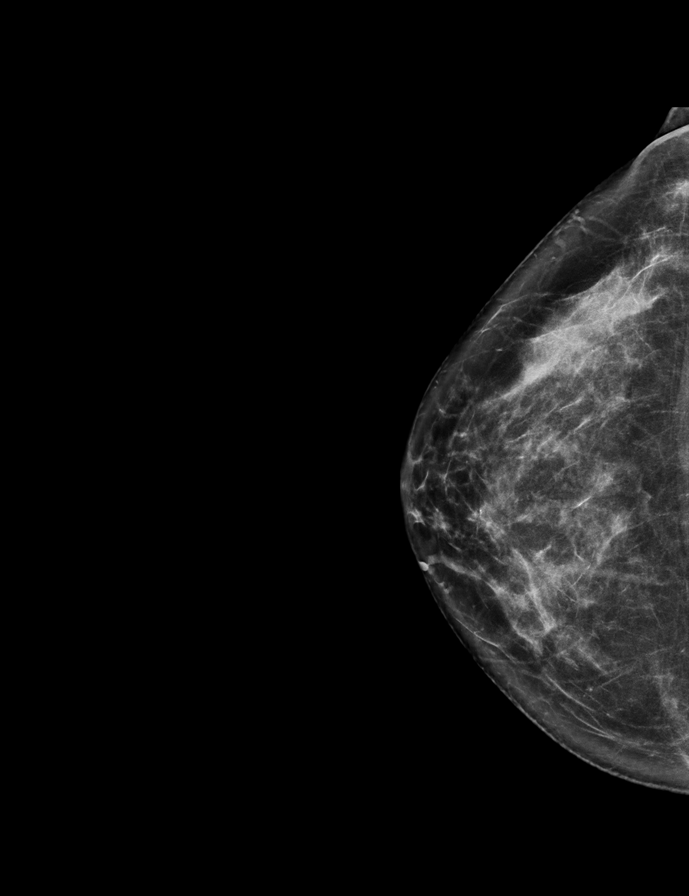

[L MLO synth-2D]
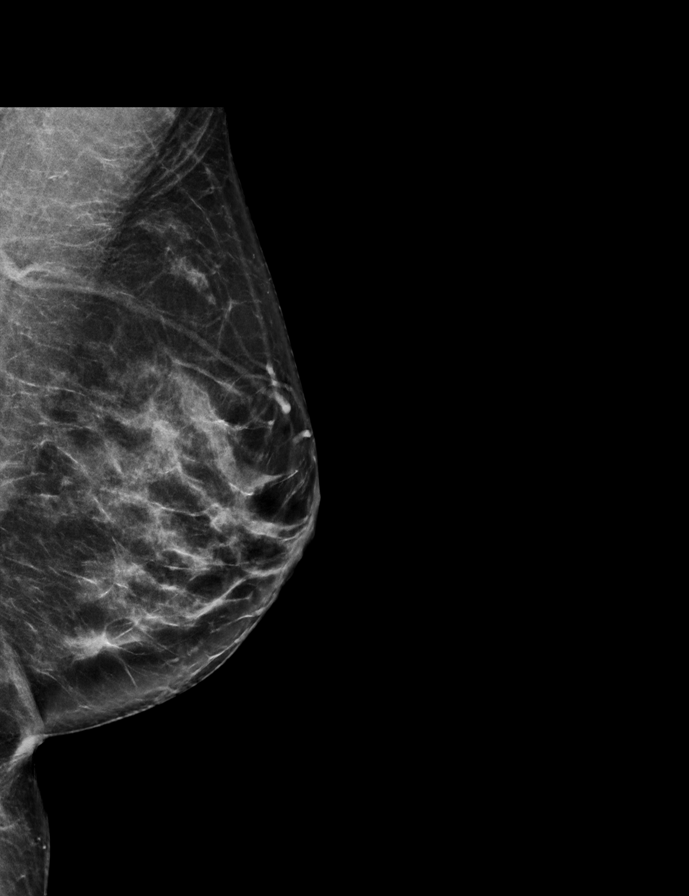

[R MLO synth-2D]
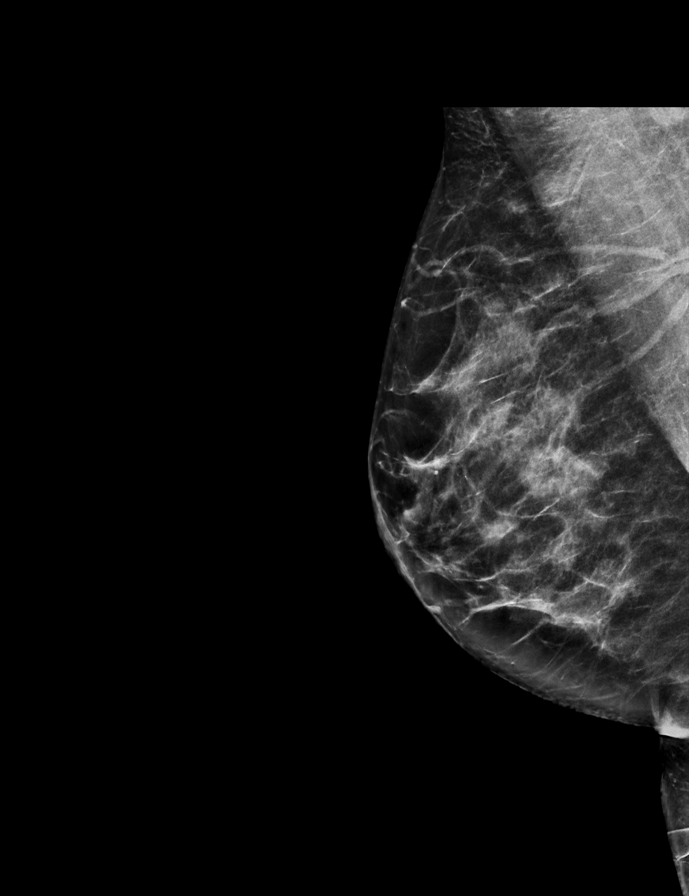

[L CC synth-2D]
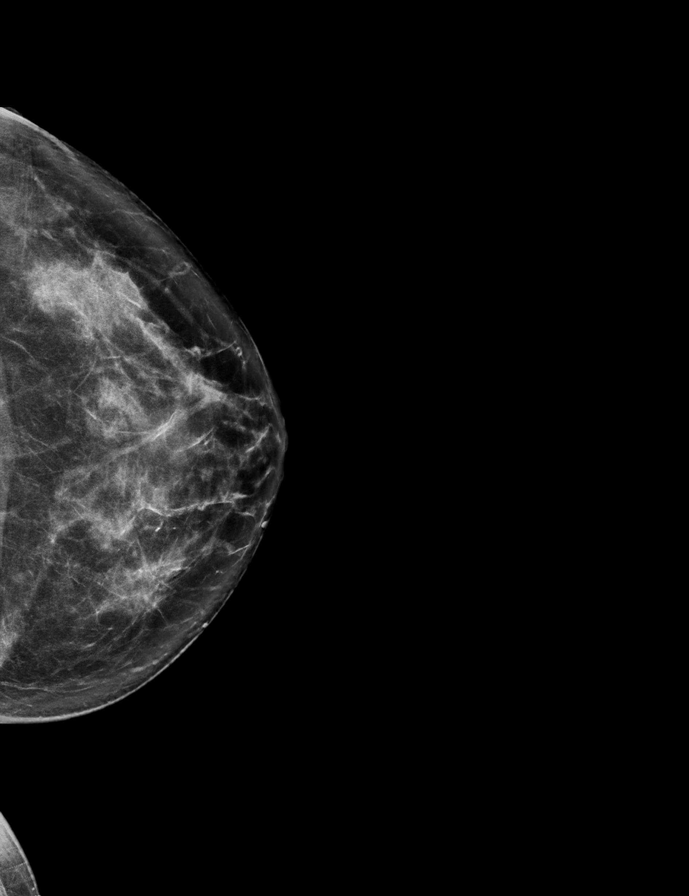

[R CC tomo · 2 of 66 frames shown]
[frame 22/66]
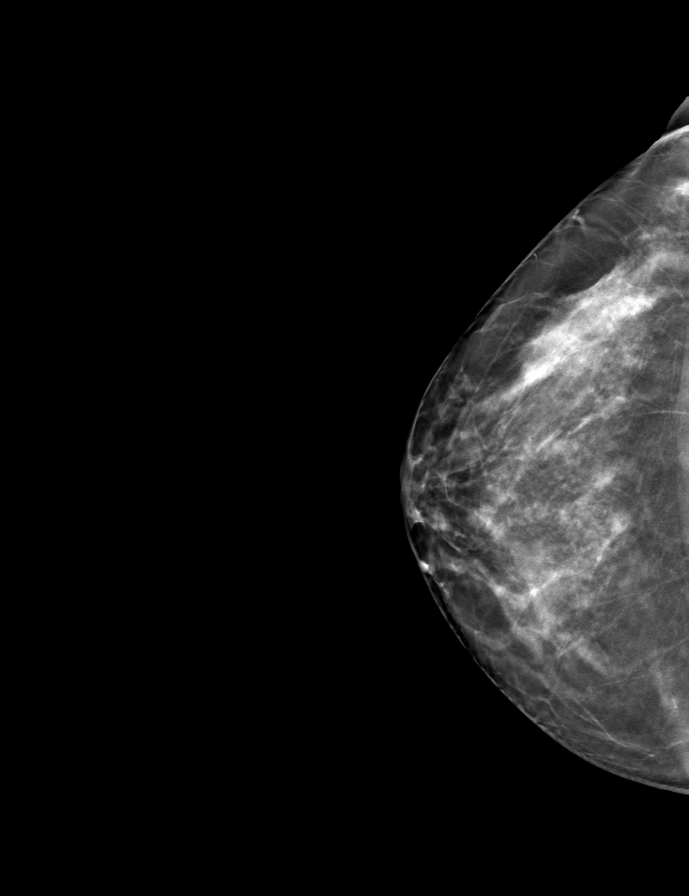
[frame 33/66]
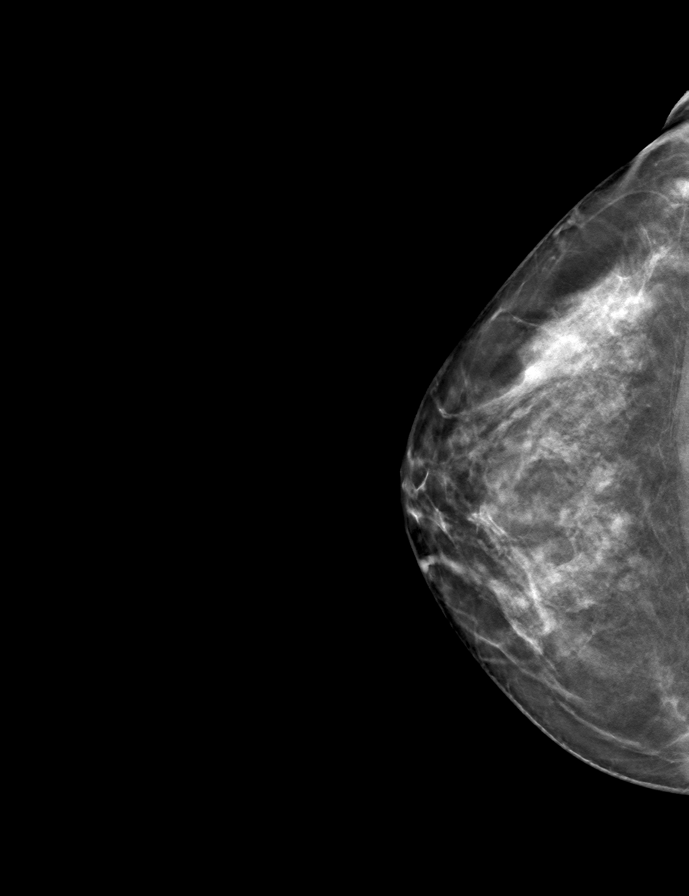

[L CC tomo · tomo slice 35/68.0]
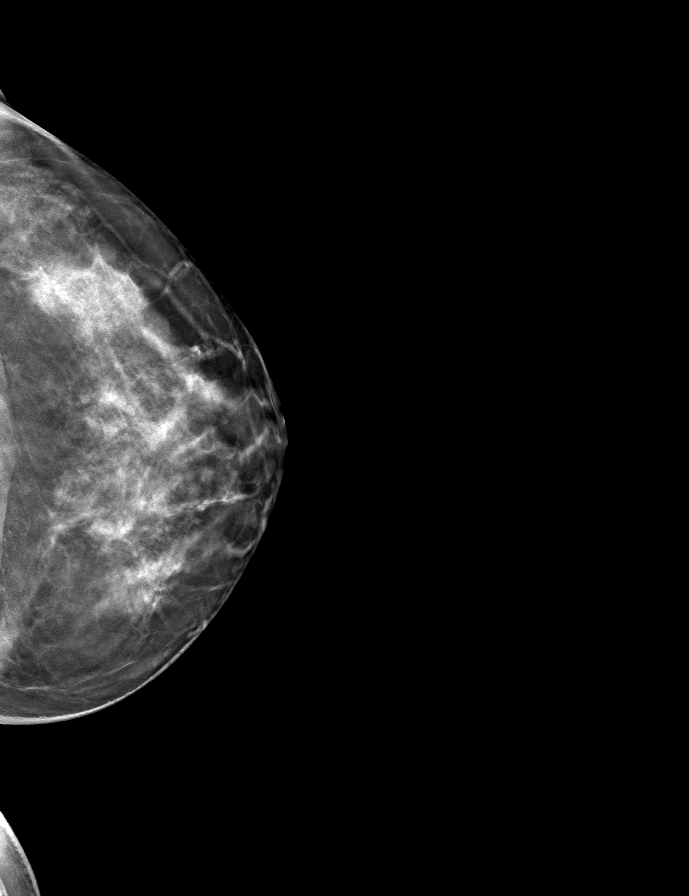

[L MLO tomo · tomo slice 34/67.0]
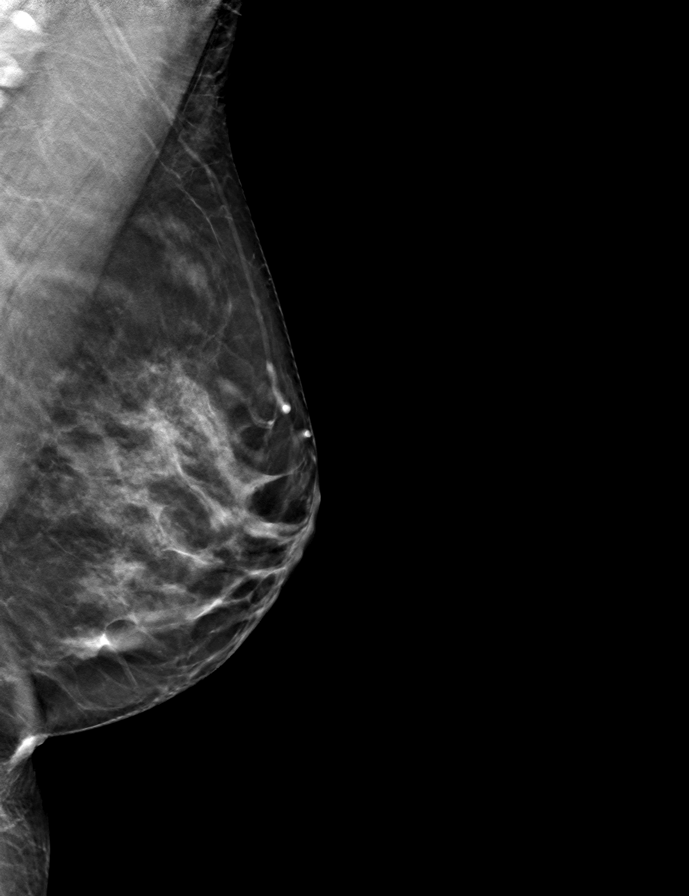

[R MLO tomo · tomo slice 33/65.0]
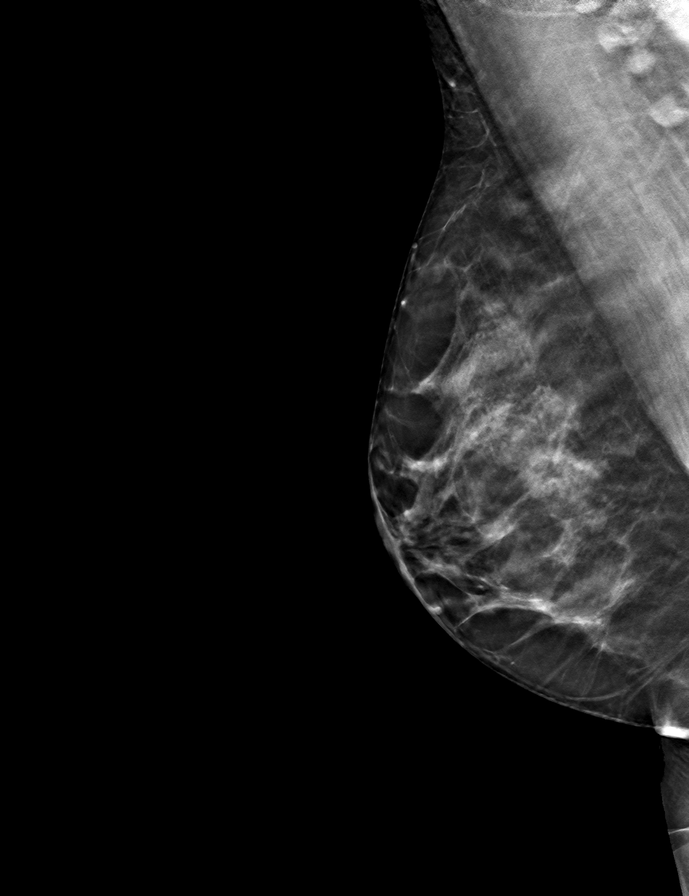

[9 of 24 positions shown; findings below may reference images not displayed]

ACR Breast Density Category c: The breast tissue is heterogeneously
dense, which may obscure small masses.
FINDINGS: In the left breast, a possible mass in the posteromedial breast
warrants further evaluation. In the right breast, no findings
suspicious for malignancy.

Images were processed with CAD.
IMPRESSION: Further evaluation is suggested for a possible mass in the left
breast.

RECOMMENDATION:
Diagnostic mammogram and possibly ultrasound of the left breast.
(Code:[id number])

The patient will be contacted regarding the findings, and additional
imaging will be scheduled.

BI-RADS CATEGORY  0: Incomplete. Need additional imaging evaluation
and/or prior mammograms for comparison.

## 2022-01-25 ENCOUNTER — Telehealth: Payer: Self-pay

## 2022-01-25 NOTE — Telephone Encounter (Signed)
Called and advised patient the only immunization that we show is a covid vaccine 02/04/20.  Patient was advised that [facility] only shows covid vaccine also. Patient stated that she will have to make some phone calls to see if she can track her records down. Patient stated that she appreciated the call back.

## 2022-01-25 NOTE — Telephone Encounter (Signed)
Patient is needing a copy of immunization records for work purposes.

## 2022-05-03 NOTE — Progress Notes (Unsigned)
    Callahan Wild T. Tyvon Eggenberger, MD, CAQ Sports Medicine [facility] [address] [place], [address]  Phone: [phone]  FAX: [phone]  PATRYCJA MUMPOWER - 46 y.o. female  MRN [id number]  Date of Birth: September 19, 1975  Date: 05/04/2022  PCP: Doreene Nest, NP  Referral: Doreene Nest, NP  No chief complaint on file.  Subjective:   Emmersen D Worsley is a 46 y.o. very pleasant female patient with There is no height or weight on file to calculate BMI. who presents with the following:  Very pleasant young female who presents with some ongoing sinus congestion and cough.    Review of Systems is noted in the HPI, as appropriate  Objective:   There were no vitals taken for this visit.  GEN: No acute distress; alert,appropriate. PULM: Breathing comfortably in no respiratory distress PSYCH: Normally interactive.   Laboratory and Imaging Data:  Assessment and Plan:   ***

## 2022-05-04 ENCOUNTER — Ambulatory Visit (INDEPENDENT_AMBULATORY_CARE_PROVIDER_SITE_OTHER): Payer: Managed Care, Other (non HMO) | Admitting: Family Medicine

## 2022-05-04 ENCOUNTER — Encounter: Payer: Self-pay | Admitting: Family Medicine

## 2022-05-04 VITALS — BP 110/60 | HR 79 | Temp 98.2°F | Ht 62.5 in | Wt 128.1 lb

## 2022-05-04 DIAGNOSIS — J208 Acute bronchitis due to other specified organisms: Secondary | ICD-10-CM

## 2022-05-04 DIAGNOSIS — R0981 Nasal congestion: Secondary | ICD-10-CM

## 2022-05-04 MED ORDER — ALBUTEROL SULFATE HFA 108 (90 BASE) MCG/ACT IN AERS: 2.0000 | INHALATION_SPRAY | Freq: Four times a day (QID) | RESPIRATORY_TRACT | 0 refills | Status: DC | PRN

## 2022-05-04 MED ORDER — DOXYCYCLINE HYCLATE 100 MG PO TABS: 100.0000 mg | ORAL_TABLET | Freq: Two times a day (BID) | ORAL | 0 refills | Status: DC

## 2022-08-04 ENCOUNTER — Ambulatory Visit
Admission: EM | Admit: 2022-08-04 | Discharge: 2022-08-04 | Disposition: A | Discharge status: Home / Self Care | Attending: Urgent Care | Admitting: Urgent Care

## 2022-08-04 DIAGNOSIS — B9689 Other specified bacterial agents as the cause of diseases classified elsewhere: Secondary | ICD-10-CM

## 2022-08-04 DIAGNOSIS — J209 Acute bronchitis, unspecified: Secondary | ICD-10-CM

## 2022-08-04 DIAGNOSIS — J019 Acute sinusitis, unspecified: Secondary | ICD-10-CM | POA: Diagnosis not present

## 2022-08-04 MED ORDER — HYDROCODONE BIT-HOMATROP MBR 5-1.5 MG/5ML PO SOLN: 5.0000 mL | Freq: Every evening | ORAL | 0 refills | Status: DC

## 2022-08-04 MED ORDER — HYDROCOD POLI-CHLORPHE POLI ER 10-8 MG/5ML PO SUER: 5.0000 mL | Freq: Two times a day (BID) | ORAL | 0 refills | Status: DC | PRN

## 2022-08-04 MED ORDER — PREDNISONE 50 MG PO TABS: 50.0000 mg | ORAL_TABLET | Freq: Every day | ORAL | 0 refills | Status: AC

## 2022-08-04 MED ORDER — DOXYCYCLINE HYCLATE 100 MG PO CAPS: 100.0000 mg | ORAL_CAPSULE | Freq: Two times a day (BID) | ORAL | 0 refills | Status: AC

## 2022-08-04 NOTE — ED Provider Notes (Signed)
Roderic Palau    CSN: [id number] Arrival date & time: 08/04/22  1524      History   Chief Complaint Chief Complaint  Patient presents with   Cough    Entered by patient   Headache    HPI Sarah Chambers is a 47 y.o. female.    Cough Associated symptoms: headaches   Headache Associated symptoms: cough     Presents to urgent care with cough x 2 weeks worsening yesterday.  Associated symptoms of sinus pressure and "chest tightness".  Patient states she is using albuterol inhaler as well as over-the-counter medications without improvement.  States albuterol was prescribed previously when she had bronchitis.  She states it is ineffective this time. Not sleeping.  He endorses clear drainage from her nose.  Cough is nonproductive.  Past Medical History:  Diagnosis Date   History of kidney stones    Seasonal allergies     Patient Active Problem List   Diagnosis Date Noted   Night sweats 11/25/2018   Preventative health care 07/02/2017   Seasonal allergies 02/25/2015    Past Surgical History:  Procedure Laterality Date   CESAREAN SECTION     1998, 2000,2007   CESAREAN SECTION  1998, 2000, 2007   TUBAL LIGATION      OB History     Gravida  3   Para  3   Term  3   Preterm  0   AB  0   Living  3      SAB  0   IAB  0   Ectopic  0   Multiple      Live Births  3            Home Medications    Prior to Admission medications   Medication Sig Start Date End Date Taking? Authorizing Provider  albuterol (VENTOLIN HFA) 108 (90 Base) MCG/ACT inhaler Inhale 2 puffs into the lungs every 6 (six) hours as needed for wheezing or shortness of breath. 05/04/22  Yes Copland, Frederico Hamman, MD  cetirizine (ZYRTEC) 10 MG tablet Take 10 mg by mouth daily.   Yes [provider]  doxycycline (VIBRA-TABS) 100 MG tablet Take 1 tablet (100 mg total) by mouth 2 (two) times daily. 05/04/22   Copland, Frederico Hamman, MD  fluticasone (FLONASE) 50 MCG/ACT nasal  spray Place 2 sprays into both nostrils daily. 01/16/18   Elby Beck, FNP    Family History History reviewed. No pertinent family history.  Social History Social History   Tobacco Use   Smoking status: Never   Smokeless tobacco: Never  Substance Use Topics   Alcohol use: No    Alcohol/week: 0.0 standard drinks of alcohol    Comment: socailly    Drug use: No     Allergies   Amoxicillin   Review of Systems Review of Systems  Respiratory:  Positive for cough.   Neurological:  Positive for headaches.     Physical Exam Triage Vital Signs ED Triage Vitals  Enc Vitals Group     BP 08/04/22 1603 139/81     Pulse Rate 08/04/22 1603 82     Resp 08/04/22 1603 16     Temp 08/04/22 1603 98.9 F (37.2 C)     Temp Source 08/04/22 1603 Oral     SpO2 08/04/22 1603 99 %     Weight --      Height --      Head Circumference --      Peak  Flow --      Pain Score 08/04/22 1610 5     Pain Loc --      Pain Edu? --      Excl. in [place]? --    No data found.  Updated Vital Signs BP 139/81 (BP Location: Left Arm)   Pulse 82   Temp 98.9 F (37.2 C) (Oral)   Resp 16   LMP 07/31/2022 (Exact Date)   SpO2 99%   Visual Acuity Right Eye Distance:   Left Eye Distance:   Bilateral Distance:    Right Eye Near:   Left Eye Near:    Bilateral Near:     Physical Exam Vitals reviewed.  Constitutional:      Appearance: She is well-developed.  HENT:     Mouth/Throat:     Pharynx: Posterior oropharyngeal erythema present. No oropharyngeal exudate.  Cardiovascular:     Rate and Rhythm: Normal rate and regular rhythm.     Heart sounds: Normal heart sounds.  Pulmonary:     Effort: Pulmonary effort is normal.     Breath sounds: Normal breath sounds.  Skin:    General: Skin is warm and dry.  Neurological:     Mental Status: She is alert and oriented to person, place, and time.  Psychiatric:        Mood and Affect: Mood normal.        Behavior: Behavior normal.      UC  Treatments / Results  Labs (all labs ordered are listed, but only abnormal results are displayed) Labs Reviewed - No data to display  EKG   Radiology No results found.  Procedures Procedures (including critical care time)  Medications Ordered in UC Medications - No data to display  Initial Impression / Assessment and Plan / UC Course  I have reviewed the triage vital signs and the nursing notes.  Pertinent labs & imaging results that were available during my care of the patient were reviewed by me and considered in my medical decision making (see chart for details).   Patient is afebrile here without recent antipyretics. Satting well on room air. Overall is well appearing, well hydrated, without respiratory distress. Pulmonary exam is unremarkable.  Lungs CTAB without wheezing, rhonchi, rales.  Minor pharyngeal erythema.  No peritonsillar exudates.  Given duration of patient's symptoms, suspicion for acute bacterial sinusitis secondary to past viral URI.  Will treat with antibiotic therapy using doxycycline given her intolerance of penicillin.  Also discussed anti-inflammatory steroid and will prescribe prednisone.  She endorses previously tolerating.  Patient acknowledges agreement with this treatment plan.  Final Clinical Impressions(s) / UC Diagnoses   Final diagnoses:  None   Discharge Instructions   None    ED Prescriptions   None    PDMP not reviewed this encounter.   Rose Phi, [place] 08/04/22 1655

## 2022-08-04 NOTE — ED Triage Notes (Signed)
Pt states she had cough for about 2 weeks now that yesterday got worse with headache, sinus pressure, and chest tightness. Pt has tried albuterol inhaler, Nyquil, mucinex and robitussin with no relief of symptoms.

## 2022-08-04 NOTE — Discharge Instructions (Signed)
Follow up here or with your primary care provider if your symptoms are worsening or not improving with treatment.          

## 2022-09-07 ENCOUNTER — Telehealth: Payer: Self-pay | Admitting: Primary Care

## 2022-09-07 DIAGNOSIS — Z0000 Encounter for general adult medical examination without abnormal findings: Secondary | ICD-10-CM

## 2022-09-07 NOTE — Telephone Encounter (Signed)
Patient has been scheduled

## 2022-09-07 NOTE — Telephone Encounter (Signed)
Noted, labs ordered. She will need a lab only appt scheduled. Thanks!

## 2022-09-07 NOTE — Addendum Note (Signed)
Addended by: Pleas Koch on: 09/07/2022 01:26 PM   Modules accepted: Orders

## 2022-09-07 NOTE — Telephone Encounter (Signed)
Patient called in and scheduled a physical. She was wanting to have her labs done before coming in for the physical. Please advise. Thank you!

## 2022-09-12 ENCOUNTER — Other Ambulatory Visit: Payer: Self-pay | Admitting: Primary Care

## 2022-09-12 DIAGNOSIS — Z1231 Encounter for screening mammogram for malignant neoplasm of breast: Secondary | ICD-10-CM

## 2022-09-13 ENCOUNTER — Other Ambulatory Visit (INDEPENDENT_AMBULATORY_CARE_PROVIDER_SITE_OTHER): Payer: Managed Care, Other (non HMO)

## 2022-09-13 DIAGNOSIS — Z0000 Encounter for general adult medical examination without abnormal findings: Secondary | ICD-10-CM

## 2022-09-13 LAB — CBC
HCT: 40.4 % (ref 36.0–46.0)
Hemoglobin: 13.6 g/dL (ref 12.0–15.0)
MCHC: 33.6 g/dL (ref 30.0–36.0)
MCV: 94.2 fl (ref 78.0–100.0)
Platelets: 263 10*3/uL (ref 150.0–400.0)
RBC: 4.28 Mil/uL (ref 3.87–5.11)
RDW: 13.2 % (ref 11.5–15.5)
WBC: 5.8 10*3/uL (ref 4.0–10.5)

## 2022-09-13 LAB — COMPREHENSIVE METABOLIC PANEL
ALT: 18 U/L (ref 0–35)
AST: 19 U/L (ref 0–37)
Albumin: 4.5 g/dL (ref 3.5–5.2)
Alkaline Phosphatase: 56 U/L (ref 39–117)
BUN: 10 mg/dL (ref 6–23)
CO2: 27 mEq/L (ref 19–32)
Calcium: 9.3 mg/dL (ref 8.4–10.5)
Chloride: 100 mEq/L (ref 96–112)
Creatinine, Ser: 0.71 mg/dL (ref 0.40–1.20)
GFR: 100.62 mL/min (ref >60.00)
Glucose, Bld: 95 mg/dL (ref 70–99)
Potassium: 3.7 mEq/L (ref 3.5–5.1)
Sodium: 136 mEq/L (ref 135–145)
Total Bilirubin: 0.5 mg/dL (ref 0.2–1.2)
Total Protein: 7 g/dL (ref 6.0–8.3)

## 2022-09-13 LAB — LIPID PANEL
Cholesterol: 227 mg/dL — ABNORMAL HIGH (ref 0–200)
HDL: 84.6 mg/dL (ref >39.00)
LDL Cholesterol: 122 mg/dL — ABNORMAL HIGH (ref 0–99)
NonHDL: 142.1
Total CHOL/HDL Ratio: 3
Triglycerides: 102 mg/dL (ref 0.0–149.0)
VLDL: 20.4 mg/dL (ref 0.0–40.0)

## 2022-09-21 ENCOUNTER — Encounter: Payer: Self-pay | Admitting: Primary Care

## 2022-09-21 ENCOUNTER — Ambulatory Visit (INDEPENDENT_AMBULATORY_CARE_PROVIDER_SITE_OTHER): Payer: Managed Care, Other (non HMO) | Admitting: Primary Care

## 2022-09-21 VITALS — BP 120/78 | HR 75 | Temp 97.9°F | Ht 62.5 in | Wt 124.0 lb

## 2022-09-21 DIAGNOSIS — G4700 Insomnia, unspecified: Secondary | ICD-10-CM

## 2022-09-21 DIAGNOSIS — J302 Other seasonal allergic rhinitis: Secondary | ICD-10-CM

## 2022-09-21 DIAGNOSIS — Z0001 Encounter for general adult medical examination with abnormal findings: Secondary | ICD-10-CM

## 2022-09-21 MED ORDER — MONTELUKAST SODIUM 10 MG PO TABS
10.0000 mg | ORAL_TABLET | Freq: Every day | ORAL | 0 refills | Status: DC
Start: 2022-09-21 — End: 2023-08-29

## 2022-09-21 NOTE — Assessment & Plan Note (Signed)
Immunizations UTD. Pap smear UTD. Mammogram due, scheduled. Colonoscopy overdue, declines despite recommendations  Discussed the importance of a healthy diet and regular exercise in order for weight loss, and to reduce the risk of further co-morbidity.  Exam stable. Labs reviewed.  Follow up in 1 year for repeat physical.

## 2022-09-21 NOTE — Assessment & Plan Note (Signed)
Could be hormonal, doesn't seem to be anxiety or sleep apnea.  Discussed to avoid watching TV 1 hour prior to bedtime.   Consider Trazodone 50 mg HS. Start with allergy treatment first.

## 2022-09-21 NOTE — Assessment & Plan Note (Signed)
Uncontrolled.  Also with what may be post viral cough vs reflux induced cough.  Stop Zyrtec. Start Xyzal.  Add Singulair 10 mg HS. Consider adding famotidine 20 mg HS, she will update in 1 week.

## 2022-09-21 NOTE — Progress Notes (Signed)
Subjective:    Patient ID: Sarah Chambers, female    DOB: June 05, 1976, 47 y.o.   MRN: [id number]  HPI  Sarah Chambers is a very pleasant 47 y.o. female who presents today for complete physical and follow up of chronic conditions.  She would also like to discuss chronic year round allergies. Symptoms include post nasal drip, chest tightness, cough, nasal congestion, shortness of breath, sore throat. Cough is worse at night with post nasal drip. She's been taking Zyrtec, Flonase, Mucinex - D (for years).  Treated at Urgent Care in March 2024 for URI, treated with prednisone, Doxycycline. Continues with continued cough. She denies a history childhood or adult asthma.   She would also like to discuss insomnia. Chronic for years. She is sleeping about three hours per night. She has difficulty falling and staying sleep. She goes to bed around 11 pm and gets up for work at 6 pm. Prior to bed she watches TV or reads a book. It takes about 60 minutes to fall asleep, will stay asleep for 3 months, then wakes and will fall asleep back at 5 am, then wake for work at 6 am. She denies anxiety symptoms, mind racing thoughts. She's tried melatonin and Tylenol PM without improvement. She denies snoring. She does have night sweats.   Immunizations: -Tetanus: Completed through employer in 2023  Diet: Fair diet.  Exercise: regular exercise  Eye exam: Completes annually  Dental exam: Completes semi-annually    Pap Smear: Completed in October 2021 Mammogram: Scheduled for May   Colonoscopy: Never completed, declines today  BP Readings from Last 3 Encounters:  09/21/22 120/78  08/04/22 139/81  05/04/22 110/60          Review of Systems  Constitutional:  Negative for unexpected weight change.  HENT:  Positive for congestion, postnasal drip and sore throat. Negative for rhinorrhea.   Eyes:  Negative for visual disturbance.  Respiratory:  Positive for cough. Negative for shortness of breath.    Cardiovascular:  Negative for chest pain.  Gastrointestinal:  Negative for constipation and diarrhea.  Genitourinary:  Negative for difficulty urinating and menstrual problem.  Musculoskeletal:  Negative for arthralgias and myalgias.  Skin:  Negative for rash.  Allergic/Immunologic: Positive for environmental allergies.  Neurological:  Negative for dizziness and headaches.  Psychiatric/Behavioral:  Positive for sleep disturbance. The patient is not nervous/anxious.          Past Medical History:  Diagnosis Date   History of kidney stones    Seasonal allergies     Social History   Socioeconomic History   Marital status: Married    Spouse name: Not on file   Number of children: Not on file   Years of education: Not on file   Highest education level: Not on file  Occupational History   Not on file  Tobacco Use   Smoking status: Never   Smokeless tobacco: Never  Substance and Sexual Activity   Alcohol use: No    Alcohol/week: 0.0 standard drinks of alcohol    Comment: socailly    Drug use: No   Sexual activity: Yes    Partners: Male  Other Topics Concern   Not on file  Social History Narrative   ** Merged History Encounter **       Married. 3 children. 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 Enjoys exercising, reading    Social Determinants of Corporate investment banker  Strain: Not on file  Food Insecurity: Not on file  Transportation Needs: Not on file  Physical Activity: Not on file  Stress: Not on file  Social Connections: Not on file  Intimate Partner Violence: Not on file    Past Surgical History:  Procedure Laterality Date   CESAREAN SECTION     1998, 9604,5409   CESAREAN SECTION  1998, 2000, 2007   TUBAL LIGATION      History reviewed. No pertinent family history.  Allergies  Allergen Reactions   Amoxicillin Hives    Current Outpatient Medications on File Prior to Visit  Medication  Sig Dispense Refill   albuterol (VENTOLIN HFA) 108 (90 Base) MCG/ACT inhaler Inhale 2 puffs into the lungs every 6 (six) hours as needed for wheezing or shortness of breath. 8 g 0   cetirizine (ZYRTEC) 10 MG tablet Take 10 mg by mouth daily.     fluticasone (FLONASE) 50 MCG/ACT nasal spray Place 2 sprays into both nostrils daily. 16 g 6   HYDROcodone bit-homatropine (HYCODAN) 5-1.5 MG/5ML syrup Take 5 mLs by mouth at bedtime. (Patient not taking: Reported on 09/21/2022) 70 mL 0   No current facility-administered medications on file prior to visit.    BP 120/78   Pulse 75   Temp 97.9 F (36.6 C) (Temporal)   Ht 5' 2.5" (1.588 m)   Wt 124 lb (56.2 kg)   SpO2 100%   BMI 22.32 kg/m  Objective:   Physical Exam HENT:     Right Ear: Tympanic membrane and ear canal normal.     Left Ear: Tympanic membrane and ear canal normal.     Nose: Nose normal.  Eyes:     Conjunctiva/sclera: Conjunctivae normal.     Pupils: Pupils are equal, round, and reactive to light.  Neck:     Thyroid: No thyromegaly.  Cardiovascular:     Rate and Rhythm: Normal rate and regular rhythm.     Heart sounds: No murmur heard. Pulmonary:     Effort: Pulmonary effort is normal.     Breath sounds: Normal breath sounds. No rales.     Comments: Dry cough noted during exam Abdominal:     General: Bowel sounds are normal.     Palpations: Abdomen is soft.     Tenderness: There is no abdominal tenderness.  Musculoskeletal:        General: Normal range of motion.     Cervical back: Neck supple.  Lymphadenopathy:     Cervical: No cervical adenopathy.  Skin:    General: Skin is warm and dry.     Findings: No rash.  Neurological:     Mental Status: She is alert and oriented to person, place, and time.     Cranial Nerves: No cranial nerve deficit.     Deep Tendon Reflexes: Reflexes are normal and symmetric.  Psychiatric:        Mood and Affect: Mood normal.           Assessment & Plan:  Encounter for annual  general medical examination with abnormal findings in adult Assessment & Plan: Immunizations UTD. Pap smear UTD. Mammogram due, scheduled. Colonoscopy overdue, declines despite recommendations  Discussed the importance of a healthy diet and regular exercise in order for weight loss, and to reduce the risk of further co-morbidity.  Exam stable. Labs reviewed.  Follow up in 1 year for repeat physical.    Seasonal allergies Assessment & Plan: Uncontrolled.  Also with what may be post viral  cough vs reflux induced cough.  Stop Zyrtec. Start Xyzal.  Add Singulair 10 mg HS. Consider adding famotidine 20 mg HS, she will update in 1 week.  Orders: -     Montelukast Sodium; Take 1 tablet (10 mg total) by mouth at bedtime. For allergies.  Dispense: 90 tablet; Refill: 0  Insomnia, unspecified type Assessment & Plan: Could be hormonal, doesn't seem to be anxiety or sleep apnea.  Discussed to avoid watching TV 1 hour prior to bedtime.   Consider Trazodone 50 mg HS. Start with allergy treatment first.          Doreene Nest, NP

## 2022-09-21 NOTE — Patient Instructions (Signed)
Stop Zyrtec.  Stop Mucinex - D  Start Xyzal 5 mg for allergies.   Start montelukast (Singulair) 10 mg at bedtime for allergies.  Start famotidine (Pepcid) 20 mg at bedtime for cough if no improvement after one week.   Please update me in 1 week!  It was a pleasure to see you today!

## 2022-10-17 ENCOUNTER — Ambulatory Visit
Admission: RE | Admit: 2022-10-17 | Discharge: 2022-10-17 | Disposition: A | Discharge status: Home / Self Care | Source: Ambulatory Visit | Attending: Primary Care | Admitting: Primary Care

## 2022-10-17 DIAGNOSIS — Z1231 Encounter for screening mammogram for malignant neoplasm of breast: Secondary | ICD-10-CM

## 2022-10-19 ENCOUNTER — Ambulatory Visit

## 2023-07-24 ENCOUNTER — Ambulatory Visit
Admission: RE | Admit: 2023-07-24 | Discharge: 2023-07-24 | Disposition: A | Discharge status: Home / Self Care | Source: Ambulatory Visit | Attending: Emergency Medicine | Admitting: Emergency Medicine

## 2023-07-24 VITALS — BP 122/82 | HR 82 | Temp 98.1°F | Resp 18

## 2023-07-24 DIAGNOSIS — J209 Acute bronchitis, unspecified: Secondary | ICD-10-CM

## 2023-07-24 DIAGNOSIS — J069 Acute upper respiratory infection, unspecified: Secondary | ICD-10-CM

## 2023-07-24 MED ORDER — AZITHROMYCIN 250 MG PO TABS: 250.0000 mg | ORAL_TABLET | Freq: Every day | ORAL | 0 refills | Status: DC

## 2023-07-24 MED ORDER — ALBUTEROL SULFATE HFA 108 (90 BASE) MCG/ACT IN AERS: 1.0000 | INHALATION_SPRAY | Freq: Four times a day (QID) | RESPIRATORY_TRACT | 0 refills | Status: AC | PRN

## 2023-07-24 MED ORDER — PROMETHAZINE-DM 6.25-15 MG/5ML PO SYRP: 5.0000 mL | ORAL_SOLUTION | Freq: Four times a day (QID) | ORAL | 0 refills | Status: DC | PRN

## 2023-07-24 MED ORDER — PREDNISONE 10 MG (21) PO TBPK: ORAL_TABLET | Freq: Every day | ORAL | 0 refills | Status: DC

## 2023-07-24 NOTE — ED Triage Notes (Addendum)
Patient to Urgent Care with complaints of persistent and lingering cough. Cough is dry. Poor sleep.   Symptoms started three weeks ago after having the flu.   Meds: Mucinex/ delsym/ zyrtec/ Flonase/ Ibuprofen.   Requests refill of albuterol inhaler.

## 2023-07-24 NOTE — Discharge Instructions (Addendum)
Use the albuterol inhaler as directed.  Take the prednisone and Zithromax as directed.    Take the Promethazine DM as directed.  Do not drive, operate machinery, drink alcohol, or perform dangerous activities while taking this medication as it may cause drowsiness.  Follow-up with your primary care provider if your symptoms are not improving.

## 2023-07-24 NOTE — ED Provider Notes (Signed)
Renaldo Fiddler    CSN: [id number] Arrival date & time: 07/24/23  0854      History   Chief Complaint Chief Complaint  Patient presents with   Cough    Entered by patient    HPI Sarah Chambers is a 48 y.o. female.  Patient presents with 3-week history of congestion and cough.  Her cough is nonproductive.  No fever or shortness of breath.  Multiple OTC treatments attempted without relief.  She also requests a refill of her albuterol inhaler.  The history is provided by the patient and medical records.    Past Medical History:  Diagnosis Date   History of kidney stones    Seasonal allergies     Patient Active Problem List   Diagnosis Date Noted   Insomnia 09/21/2022   Night sweats 11/25/2018   Encounter for annual general medical examination with abnormal findings in adult 07/02/2017   Seasonal allergies 02/25/2015    Past Surgical History:  Procedure Laterality Date   CESAREAN SECTION     1998, 2000,2007   CESAREAN SECTION  1998, 2000, 2007   TUBAL LIGATION      OB History     Gravida  3   Para  3   Term  3   Preterm  0   AB  0   Living  3      SAB  0   IAB  0   Ectopic  0   Multiple      Live Births  3            Home Medications    Prior to Admission medications   Medication Sig Start Date End Date Taking? Authorizing Provider  albuterol (VENTOLIN HFA) 108 (90 Base) MCG/ACT inhaler Inhale 1-2 puffs into the lungs every 6 (six) hours as needed. 07/24/23  Yes Mickie Bail, NP  azithromycin (ZITHROMAX) 250 MG tablet Take 1 tablet (250 mg total) by mouth daily. Take first 2 tablets together, then 1 every day until finished. 07/24/23  Yes Mickie Bail, NP  predniSONE (STERAPRED UNI-PAK 21 TAB) 10 MG (21) TBPK tablet Take by mouth daily. As directed 07/24/23  Yes Mickie Bail, NP  promethazine-dextromethorphan (PROMETHAZINE-DM) 6.25-15 MG/5ML syrup Take 5 mLs by mouth 4 (four) times daily as needed. 07/24/23  Yes Mickie Bail, NP   cetirizine (ZYRTEC) 10 MG tablet Take 10 mg by mouth daily.    [provider]  fluticasone (FLONASE) 50 MCG/ACT nasal spray Place 2 sprays into both nostrils daily. 01/16/18   Emi Belfast, FNP  montelukast (SINGULAIR) 10 MG tablet Take 1 tablet (10 mg total) by mouth at bedtime. For allergies. Patient not taking: Reported on 07/24/2023 09/21/22   Doreene Nest, NP    Family History History reviewed. No pertinent family history.  Social History Social History   Tobacco Use   Smoking status: Never   Smokeless tobacco: Never  Substance Use Topics   Alcohol use: No    Alcohol/week: 0.0 standard drinks of alcohol    Comment: socailly    Drug use: No     Allergies   Amoxicillin   Review of Systems Review of Systems  Constitutional:  Negative for chills and fever.  HENT:  Positive for congestion. Negative for ear pain and sore throat.   Respiratory:  Positive for cough. Negative for shortness of breath.   Gastrointestinal:  Negative for diarrhea and vomiting.     Physical Exam Triage Vital  Signs ED Triage Vitals  Encounter Vitals Group     BP      Systolic BP Percentile      Diastolic BP Percentile      Pulse      Resp      Temp      Temp src      SpO2      Weight      Height      Head Circumference      Peak Flow      Pain Score      Pain Loc      Pain Education      Exclude from Growth Chart    No data found.  Updated Vital Signs BP 122/82   Pulse 82   Temp 98.1 F (36.7 C)   Resp 18   LMP 07/06/2023   SpO2 98%   Visual Acuity Right Eye Distance:   Left Eye Distance:   Bilateral Distance:    Right Eye Near:   Left Eye Near:    Bilateral Near:     Physical Exam Constitutional:      General: She is not in acute distress. HENT:     Right Ear: Tympanic membrane normal.     Left Ear: Tympanic membrane normal.     Nose: Rhinorrhea present.     Mouth/Throat:     Mouth: Mucous membranes are moist.     Pharynx: Oropharynx is  clear.  Cardiovascular:     Rate and Rhythm: Normal rate and regular rhythm.     Heart sounds: Normal heart sounds.  Pulmonary:     Effort: Pulmonary effort is normal. No respiratory distress.     Breath sounds: Normal breath sounds.  Neurological:     Mental Status: She is alert.      UC Treatments / Results  Labs (all labs ordered are listed, but only abnormal results are displayed) Labs Reviewed - No data to display  EKG   Radiology No results found.  Procedures Procedures (including critical care time)  Medications Ordered in UC Medications - No data to display  Initial Impression / Assessment and Plan / UC Course  I have reviewed the triage vital signs and the nursing notes.  Pertinent labs & imaging results that were available during my care of the patient were reviewed by me and considered in my medical decision making (see chart for details).    Acute upper respiratory infection, acute bronchitis.  Patient has been symptomatic for 3 weeks and is not improving with OTC treatment.  Treating today with albuterol inhaler, prednisone, Zithromax, Promethazine DM.  Precautions for drowsiness with promethazine discussed.  Instructed patient to follow-up with her PCP if she is not improving.  Education provided on bronchitis and URI.  She agrees to plan of care.  Final Clinical Impressions(s) / UC Diagnoses   Final diagnoses:  Acute upper respiratory infection  Acute bronchitis, unspecified organism     Discharge Instructions      Use the albuterol inhaler as directed.  Take the prednisone and Zithromax as directed.    Take the Promethazine DM as directed.  Do not drive, operate machinery, drink alcohol, or perform dangerous activities while taking this medication as it may cause drowsiness.  Follow-up with your primary care provider if your symptoms are not improving.      ED Prescriptions     Medication Sig Dispense Auth. Provider   albuterol (VENTOLIN  HFA) 108 (90 Base) MCG/ACT inhaler Inhale  1-2 puffs into the lungs every 6 (six) hours as needed. 18 g Mickie Bail, NP   predniSONE (STERAPRED UNI-PAK 21 TAB) 10 MG (21) TBPK tablet Take by mouth daily. As directed 21 tablet Mickie Bail, NP   azithromycin (ZITHROMAX) 250 MG tablet Take 1 tablet (250 mg total) by mouth daily. Take first 2 tablets together, then 1 every day until finished. 6 tablet Mickie Bail, NP   promethazine-dextromethorphan (PROMETHAZINE-DM) 6.25-15 MG/5ML syrup Take 5 mLs by mouth 4 (four) times daily as needed. 118 mL Mickie Bail, NP      PDMP not reviewed this encounter.   Mickie Bail, NP 07/24/23 [phone]

## 2023-08-29 ENCOUNTER — Encounter: Payer: Self-pay | Admitting: Family Medicine

## 2023-08-29 ENCOUNTER — Ambulatory Visit (INDEPENDENT_AMBULATORY_CARE_PROVIDER_SITE_OTHER)
Admission: RE | Admit: 2023-08-29 | Discharge: 2023-08-29 | Disposition: A | Discharge status: Home / Self Care | Source: Ambulatory Visit | Attending: Family Medicine | Admitting: Family Medicine

## 2023-08-29 ENCOUNTER — Ambulatory Visit (INDEPENDENT_AMBULATORY_CARE_PROVIDER_SITE_OTHER): Admitting: Family Medicine

## 2023-08-29 VITALS — BP 106/68 | HR 97 | Temp 98.0°F | Ht 62.5 in | Wt 131.2 lb

## 2023-08-29 DIAGNOSIS — R0789 Other chest pain: Secondary | ICD-10-CM | POA: Diagnosis not present

## 2023-08-29 DIAGNOSIS — R058 Other specified cough: Secondary | ICD-10-CM

## 2023-08-29 DIAGNOSIS — J302 Other seasonal allergic rhinitis: Secondary | ICD-10-CM | POA: Diagnosis not present

## 2023-08-29 MED ORDER — PREDNISONE 10 MG PO TABS: ORAL_TABLET | ORAL | 0 refills | Status: DC

## 2023-08-29 MED ORDER — HYDROCODONE BIT-HOMATROP MBR 5-1.5 MG/5ML PO SOLN: 5.0000 mL | Freq: Three times a day (TID) | ORAL | 0 refills | Status: DC | PRN

## 2023-08-29 MED ORDER — FAMOTIDINE 20 MG PO TABS: 20.0000 mg | ORAL_TABLET | Freq: Two times a day (BID) | ORAL | 0 refills | Status: DC

## 2023-08-29 NOTE — Assessment & Plan Note (Signed)
 With over a month of cough Rib tenderness -anterior lower/ under breast  Cxr ordered

## 2023-08-29 NOTE — Progress Notes (Signed)
 Subjective:    Patient ID: Sarah Chambers, female    DOB: [birth year], 48 y.o.   MRN: [id number]  HPI  Wt Readings from Last 3 Encounters:  08/29/23 131 lb 4 oz (59.5 kg)  09/21/22 124 lb (56.2 kg)  05/04/22 128 lb 2 oz (58.1 kg)   23.62 kg/m  Vitals:   08/29/23 0826  BP: 106/68  Pulse: 97  Temp: 98 F (36.7 C)  SpO2: 100%   48 yo pt of NP [facility] presents with cough and congestion from uri     Was [facility] in [facility] [place] on 07/24/23 At that point had a 3 wk history of symptoms (per pt may have started with flu)  Given length of illness she was prescription prednisone, zpak and prometh DM  Did get a little better   Now cough persists - keeping her up at night  Mildly productive - feels like mucous is stuck  (unsure if color to the mucous) Some wheezing   Cough medicine did not help   Pain in right anterior ribs under breast- hurts to cough   Very congested -this is more new  Blows nose all day long  Mucous is yellow /light   Has chills frequently but no fever Just does not feel good   Throat is less sore than it was but not normal  Has a hoarse voice  Ears are clogged feeling    Symptom care Inhaler at least daily  Mucinex dm  Flonase  Just changed from zyrtec to xyzal   Used fast acting nasal spray briefly a week ago    Cxr today  DG Chest 2 View Result Date: 08/29/2023 CLINICAL DATA:  Cough. Congestion and left anterior chest wall pain. EXAM: CHEST - 2 VIEW COMPARISON:  05/09/2016 FINDINGS: Lung volumes are similar to the previous examination. Slightly prominent lung markings in the upper lungs bilaterally but no large areas of airspace disease or consolidation. Heart and mediastinum are within normal limits. Trachea is midline. Negative for a pneumothorax. No pleural effusions. No acute bone abnormality. IMPRESSION: Slightly prominent lung markings in upper lungs are nonspecific and could represent postinflammatory or bronchitic changes. No clear  evidence for an acute cardiopulmonary process. Electronically Signed   By: Richarda Overlie M.D.   On: 08/29/2023 10:08     Patient Active Problem List   Diagnosis Date Noted   Post-viral cough syndrome 08/29/2023   Chest wall pain 08/29/2023   Insomnia 09/21/2022   Night sweats 11/25/2018   Encounter for annual general medical examination with abnormal findings in adult 07/02/2017   Seasonal allergies 02/25/2015   Past Medical History:  Diagnosis Date   History of kidney stones    Seasonal allergies    Past Surgical History:  Procedure Laterality Date   CESAREAN SECTION     1998, 2000,2007   CESAREAN SECTION  1998, 2000, 2007   TUBAL LIGATION     Social History   Tobacco Use   Smoking status: Never   Smokeless tobacco: Never  Substance Use Topics   Alcohol use: No    Alcohol/week: 0.0 standard drinks of alcohol    Comment: socailly    Drug use: No   History reviewed. No pertinent family history. Allergies  Allergen Reactions   Amoxicillin Hives   Current Outpatient Medications on File Prior to Visit  Medication Sig Dispense Refill   albuterol (VENTOLIN HFA) 108 (90 Base) MCG/ACT inhaler Inhale 1-2 puffs into the lungs every 6 (six) hours as  needed. 18 g 0   cetirizine (ZYRTEC) 10 MG tablet Take 10 mg by mouth daily.     fluticasone (FLONASE) 50 MCG/ACT nasal spray Place 2 sprays into both nostrils daily. 16 g 6   No current facility-administered medications on file prior to visit.    Review of Systems  Constitutional:  Positive for chills and fatigue. Negative for fever.  HENT:  Positive for congestion, postnasal drip, rhinorrhea and voice change. Negative for sinus pressure, sinus pain and sore throat.   Respiratory:  Positive for cough, chest tightness and wheezing.   Gastrointestinal:  Negative for diarrhea, nausea and vomiting.  Neurological:  Negative for headaches.       Objective:   Physical Exam Constitutional:      General: She is not in acute  distress.    Appearance: Normal appearance. She is well-developed and normal weight. She is not ill-appearing, toxic-appearing or diaphoretic.  HENT:     Head: Normocephalic and atraumatic.     Comments: Nares are injected and congested      Right Ear: Tympanic membrane, ear canal and external ear normal.     Left Ear: Tympanic membrane, ear canal and external ear normal.     Nose: Congestion and rhinorrhea present.     Mouth/Throat:     Mouth: Mucous membranes are moist.     Pharynx: Oropharynx is clear. No oropharyngeal exudate or posterior oropharyngeal erythema.     Comments: Clear pnd  Eyes:     General:        Right eye: No discharge.        Left eye: No discharge.     Conjunctiva/sclera: Conjunctivae normal.     Pupils: Pupils are equal, round, and reactive to light.  Cardiovascular:     Rate and Rhythm: Normal rate.     Heart sounds: Normal heart sounds.  Pulmonary:     Effort: Pulmonary effort is normal. No respiratory distress.     Breath sounds: No stridor. Wheezing and rhonchi present. No rales.     Comments: Scattered rhonchi Wheeze on forced exp  No prolonged exp phase   No rales   Mildly tender over right anterior/lower ribs No step off or crepitus  Chest:     Chest wall: Tenderness present.  Musculoskeletal:     Cervical back: Normal range of motion and neck supple.  Lymphadenopathy:     Cervical: No cervical adenopathy.  Skin:    General: Skin is warm and dry.     Capillary Refill: Capillary refill takes less than 2 seconds.     Findings: No rash.  Neurological:     Mental Status: She is alert.     Cranial Nerves: No cranial nerve deficit.  Psychiatric:        Mood and Affect: Mood normal.           Assessment & Plan:   Problem List Items Addressed This Visit       Respiratory   Post-viral cough syndrome - Primary   Over 1 month Reviewed uc note and plan from 2/18   Exam consistent with bronchitis  Seasonal allergies not doubt playing a  role  Some cw pain from cough also  Cxr ordered : no signs of pna / some prominent lung markings (post inflam or bronchitic change) Prednisone 40 mg taper (reviewed side effects) Hycodan with caution of sedation  Mucinex  Pepcid for heartburn (she gets from prednisone)  Update if not starting to improve in a  week or if worsening  Call back and Er precautions noted in detail today        Relevant Orders   DG Chest 2 View (Completed)     Other   Seasonal allergies   No doubt adding to current cough and congestion  Will continue Flonase Xyzal   Prescription prednisone (for cough/bronchitis symptoms) which should help as well  Pepcid for 1 mo      Chest wall pain   With over a month of cough Rib tenderness -anterior lower/ under breast  Cxr ordered       Relevant Orders   DG Chest 2 View (Completed)

## 2023-08-29 NOTE — Assessment & Plan Note (Signed)
 No doubt adding to current cough and congestion  Will continue Flonase Xyzal   Prescription prednisone (for cough/bronchitis symptoms) which should help as well  Pepcid for 1 mo

## 2023-08-29 NOTE — Patient Instructions (Addendum)
 Continue mucinex if it helps  Try hycodan cough syrup with caution of sedation  Take pepcid for acid reflux Prednisone 40 mg taper   Continue allergy medicines  Chest xray today   We will reach out with results   If worse or trouble breathing- go to the ER   Update if not starting to improve in a week or if worsening

## 2023-08-29 NOTE — Assessment & Plan Note (Addendum)
 Over 1 month Reviewed uc note and plan from 2/18   Exam consistent with bronchitis  Seasonal allergies not doubt playing a role  Some cw pain from cough also  Cxr ordered : no signs of pna / some prominent lung markings (post inflam or bronchitic change) Prednisone 40 mg taper (reviewed side effects) Hycodan with caution of sedation  Mucinex  Pepcid for heartburn (she gets from prednisone)  Update if not starting to improve in a week or if worsening  Call back and Er precautions noted in detail today

## 2023-11-21 ENCOUNTER — Ambulatory Visit (INDEPENDENT_AMBULATORY_CARE_PROVIDER_SITE_OTHER): Admitting: Primary Care

## 2023-11-21 ENCOUNTER — Ambulatory Visit: Admission: RE | Admit: 2023-11-21 | Source: Ambulatory Visit

## 2023-11-21 ENCOUNTER — Encounter: Payer: Self-pay | Admitting: Primary Care

## 2023-11-21 ENCOUNTER — Ambulatory Visit (INDEPENDENT_AMBULATORY_CARE_PROVIDER_SITE_OTHER)
Admission: RE | Admit: 2023-11-21 | Discharge: 2023-11-21 | Disposition: A | Discharge status: Home / Self Care | Source: Ambulatory Visit | Attending: Primary Care | Admitting: Primary Care

## 2023-11-21 VITALS — BP 124/74 | HR 65 | Temp 98.1°F | Ht 62.5 in | Wt 127.6 lb

## 2023-11-21 DIAGNOSIS — M79602 Pain in left arm: Secondary | ICD-10-CM

## 2023-11-21 DIAGNOSIS — G8929 Other chronic pain: Secondary | ICD-10-CM

## 2023-11-21 DIAGNOSIS — M542 Cervicalgia: Secondary | ICD-10-CM

## 2023-11-21 HISTORY — DX: Pain in left arm: M79.602

## 2023-11-21 MED ORDER — METHOCARBAMOL 500 MG PO TABS: 500.0000 mg | ORAL_TABLET | Freq: Three times a day (TID) | ORAL | 0 refills | Status: DC | PRN

## 2023-11-21 MED ORDER — PREDNISONE 20 MG PO TABS: ORAL_TABLET | ORAL | 0 refills | Status: DC

## 2023-11-21 NOTE — Patient Instructions (Signed)
 You may take methocarbamol  500 mg tablets 3 times daily as needed for muscle spasms.  This may cause drowsiness.  Start prednisone  20 mg tablets. Take 3 tablets by mouth every morning x 3 days, then 2 tablets x 2 days, then 1 tablet x 2 days.  Complete xray(s) prior to leaving today. I will notify you of your results once received.  It was a pleasure to see you today!

## 2023-11-21 NOTE — Assessment & Plan Note (Signed)
 Do suspect upper extremity symptoms are originating from the cervical spine given HPI and presentation today.  Plain films of the cervical spine ordered and pending today. Start methocarbamol  500 mg 3 times daily as needed.  Drowsiness precautions provided. Start prednisone  taper course.  Consider referral to sports med versus orthopedics based on x-ray results.

## 2023-11-21 NOTE — Progress Notes (Signed)
 Subjective:    Patient ID: Sarah Chambers, female    DOB: November 18, 1975, 48 y.o.   MRN: [id number]  Arm Pain  Associated symptoms include numbness.    Sarah Chambers is a very pleasant 48 y.o. female with a history of night sweats, insomnia, chest wall pain who presents today to discuss upper extremity pain.  Symptom onset 3 weeks ago with acute on chronic left lateral neck pain. She then began noticing acute left humeral upper extremity with intermittent radiation down to her wrist and intermittent numbness to the finger tips. Her left humeral pain is what bothers her the most.   She describes her humeral pain as sore, throbbing, shooting and burning sensation. She has noticed decrease in ROM to her left shoulder due to the pain in her left upper extremity.   She denies injury/trauma, right neck pain, shoulder pain. She does have a chronic history of left sided neck pain, occurs intermittently and infrequently.   She's taken cyclobenzaprine without improvement. She's also taking 800 mg of Ibuprofen twice daily with temporary relief.    Review of Systems  Musculoskeletal:  Positive for arthralgias, myalgias and neck pain.  Skin:  Negative for color change.  Neurological:  Positive for numbness. Negative for weakness.         Past Medical History:  Diagnosis Date   History of kidney stones    Seasonal allergies     Social History   Socioeconomic History   Marital status: Married    Spouse name: Not on file   Number of children: Not on file   Years of education: Not on file   Highest education level: Not on file  Occupational History   Not on file  Tobacco Use   Smoking status: Never   Smokeless tobacco: Never  Substance and Sexual Activity   Alcohol use: No    Alcohol/week: 0.0 standard drinks of alcohol    Comment: socailly    Drug use: No   Sexual activity: Yes    Partners: Male  Other Topics Concern   Not on file  Social History Narrative   ** Merged  History Encounter **       Married. 3 children. 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 Enjoys exercising, reading    Social Drivers of Health   Financial Resource Strain: Not on file  Food Insecurity: Not on file  Transportation Needs: Not on file  Physical Activity: Not on file  Stress: Not on file  Social Connections: Not on file  Intimate Partner Violence: Not on file    Past Surgical History:  Procedure Laterality Date   CESAREAN SECTION     1998, 7829,5621   CESAREAN SECTION  1998, 2000, 2007   TUBAL LIGATION      History reviewed. No pertinent family history.  Allergies  Allergen Reactions   Amoxicillin Hives    Current Outpatient Medications on File Prior to Visit  Medication Sig Dispense Refill   albuterol  (VENTOLIN  HFA) 108 (90 Base) MCG/ACT inhaler Inhale 1-2 puffs into the lungs every 6 (six) hours as needed. 18 g 0   cetirizine (ZYRTEC) 10 MG tablet Take 10 mg by mouth daily.     famotidine  (PEPCID ) 20 MG tablet Take 1 tablet (20 mg total) by mouth 2 (two) times daily. 60 tablet 0   fluticasone  (FLONASE ) 50 MCG/ACT nasal spray Place 2 sprays into both nostrils daily. 16 g 6  No current facility-administered medications on file prior to visit.    BP 124/74   Pulse 65   Temp 98.1 F (36.7 C) (Oral)   Ht 5' 2.5 (1.588 m)   Wt 127 lb 9.6 oz (57.9 kg)   SpO2 98%   BMI 22.97 kg/m  Objective:   Physical Exam Neck:     Comments: Slight decrease in ROM with pain with right and left lateral rotation.  Cardiovascular:     Rate and Rhythm: Normal rate and regular rhythm.  Pulmonary:     Effort: Pulmonary effort is normal.     Breath sounds: Normal breath sounds.   Musculoskeletal:     Right shoulder: Normal range of motion. Normal strength.     Left shoulder: Decreased range of motion. Normal strength.     Left upper arm: Tenderness present. No swelling, deformity or bony tenderness.     Cervical  back: Muscular tenderness present. No spinous process tenderness. Decreased range of motion.     Comments: 5/5 strength to bilateral upper extremities. Negative empty can test bilaterally   Skin:    General: Skin is warm and dry.   Neurological:     Mental Status: She is alert.           Assessment & Plan:  Chronic neck pain Assessment & Plan: Do suspect upper extremity symptoms are originating from the cervical spine given HPI and presentation today.  Plain films of the cervical spine ordered and pending today. Start methocarbamol  500 mg 3 times daily as needed.  Drowsiness precautions provided. Start prednisone  taper course.  Consider referral to sports med versus orthopedics based on x-ray results.  Orders: -     DG Cervical Spine Complete  Pain of left upper extremity Assessment & Plan: Likely originating from cervical spine.  She could also be experiencing bursitis to the left shoulder.  Plan films of the cervical spine ordered and pending today. Start methocarbamol  500 mg 3 times daily as needed.  Drowsiness precautions provided. Start prednisone  20 mg tablets. Take 3 tablets by mouth every morning x 3 days, then 2 tablets x 2 days, then 1 tablet x 2 days.  She will update. Consider sports med versus orthopedic referral.  Orders: -     DG Cervical Spine Complete -     predniSONE ; Take 3 tablets by mouth every morning x 3 days, then 2 tablets x 2 days, then 1 tablet x 2 days.  Dispense: 18 tablet; Refill: 0 -     Methocarbamol ; Take 1 tablet (500 mg total) by mouth every 8 (eight) hours as needed for muscle spasms.  Dispense: 15 tablet; Refill: 0        Gabriel John, NP

## 2023-11-21 NOTE — Assessment & Plan Note (Signed)
 Likely originating from cervical spine.  She could also be experiencing bursitis to the left shoulder.  Plan films of the cervical spine ordered and pending today. Start methocarbamol  500 mg 3 times daily as needed.  Drowsiness precautions provided. Start prednisone  20 mg tablets. Take 3 tablets by mouth every morning x 3 days, then 2 tablets x 2 days, then 1 tablet x 2 days.  She will update. Consider sports med versus orthopedic referral.

## 2023-12-03 ENCOUNTER — Ambulatory Visit: Payer: Self-pay | Admitting: Primary Care

## 2024-01-11 ENCOUNTER — Encounter: Payer: Self-pay | Admitting: Primary Care

## 2024-01-11 ENCOUNTER — Ambulatory Visit (INDEPENDENT_AMBULATORY_CARE_PROVIDER_SITE_OTHER): Admitting: Primary Care

## 2024-01-11 VITALS — BP 138/68 | HR 129 | Temp 97.8°F | Ht 62.5 in | Wt 128.0 lb

## 2024-01-11 DIAGNOSIS — R051 Acute cough: Secondary | ICD-10-CM | POA: Diagnosis not present

## 2024-01-11 MED ORDER — HYDROCODONE BIT-HOMATROP MBR 5-1.5 MG/5ML PO SOLN: 5.0000 mL | Freq: Three times a day (TID) | ORAL | 0 refills | Status: AC | PRN

## 2024-01-11 MED ORDER — DOXYCYCLINE HYCLATE 100 MG PO TABS
100.0000 mg | ORAL_TABLET | Freq: Two times a day (BID) | ORAL | 0 refills | Status: DC
Start: 2024-01-11 — End: 2024-01-17

## 2024-01-11 NOTE — Progress Notes (Signed)
 Subjective:    Patient ID: Sarah Chambers, female    DOB: [birth year], 48 y.o.   MRN: [id number]  Cough Associated symptoms include headaches, postnasal drip and shortness of breath. Pertinent negatives include no chills.    Sarah Chambers is a very pleasant 48 y.o. female with a history of seasonal allergies who presents today to discuss cough.  Symptom onset 12 days ago with sore throat and dry cough. Since then she's developed post nasal drip, nasal and chest congestion. She had to spend several days in bed for a few days recently.   Prior to symptom onset she was on a cruise. She and her entire family are sick.   She's taken Zyrtec, Flonase , Mucinex, Delsym, Robitussin without improvement.   Overall she's no better. She never tested Covid-19 infection.    Review of Systems  Constitutional:  Positive for fatigue. Negative for chills.  HENT:  Positive for congestion and postnasal drip.   Respiratory:  Positive for cough, chest tightness and shortness of breath.   Neurological:  Positive for headaches.         Past Medical History:  Diagnosis Date   History of kidney stones    Seasonal allergies     Social History   Socioeconomic History   Marital status: Married    Spouse name: Not on file   Number of children: Not on file   Years of education: Not on file   Highest education level: Not on file  Occupational History   Not on file  Tobacco Use   Smoking status: Never   Smokeless tobacco: Never  Substance and Sexual Activity   Alcohol use: No    Alcohol/week: 0.0 standard drinks of alcohol    Comment: socailly    Drug use: No   Sexual activity: Yes    Partners: Male  Other Topics Concern   Not on file  Social History Narrative   ** Merged History Encounter **       Married. 3 children. 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 Enjoys exercising, reading    Social Drivers of Health   Financial  Resource Strain: Not on file  Food Insecurity: Not on file  Transportation Needs: Not on file  Physical Activity: Not on file  Stress: Not on file  Social Connections: Not on file  Intimate Partner Violence: Not on file    Past Surgical History:  Procedure Laterality Date   CESAREAN SECTION     1998, [id number]   CESAREAN SECTION  1998, 2000, 2007   TUBAL LIGATION      History reviewed. No pertinent family history.  Allergies  Allergen Reactions   Amoxicillin Hives    Current Outpatient Medications on File Prior to Visit  Medication Sig Dispense Refill   cetirizine (ZYRTEC) 10 MG tablet Take 10 mg by mouth daily.     fluticasone  (FLONASE ) 50 MCG/ACT nasal spray Place 2 sprays into both nostrils daily. 16 g 6   albuterol  (VENTOLIN  HFA) 108 (90 Base) MCG/ACT inhaler Inhale 1-2 puffs into the lungs every 6 (six) hours as needed. (Patient not taking: Reported on 01/11/2024) 18 g 0   famotidine  (PEPCID ) 20 MG tablet Take 1 tablet (20 mg total) by mouth 2 (two) times daily. (Patient not taking: Reported on 01/11/2024) 60 tablet 0   methocarbamol  (ROBAXIN ) 500 MG tablet Take 1 tablet (500 mg total) by mouth every 8 (eight) hours as needed for muscle  spasms. (Patient not taking: Reported on 01/11/2024) 15 tablet 0   predniSONE  (DELTASONE ) 20 MG tablet Take 3 tablets by mouth every morning x 3 days, then 2 tablets x 2 days, then 1 tablet x 2 days. (Patient not taking: Reported on 01/11/2024) 18 tablet 0   No current facility-administered medications on file prior to visit.    BP 138/68   Pulse (!) 129   Temp 97.8 F (36.6 C) (Temporal)   Ht 5' 2.5 (1.588 m)   Wt 128 lb (58.1 kg)   LMP 12/31/2023   SpO2 100%   BMI 23.04 kg/m  Objective:   Physical Exam Constitutional:      Appearance: She is ill-appearing.  HENT:     Right Ear: Tympanic membrane and ear canal normal.     Left Ear: Tympanic membrane and ear canal normal.     Nose: No mucosal edema.     Right Sinus: No maxillary  sinus tenderness or frontal sinus tenderness.     Left Sinus: No maxillary sinus tenderness or frontal sinus tenderness.     Mouth/Throat:     Mouth: Mucous membranes are moist.  Eyes:     Conjunctiva/sclera: Conjunctivae normal.  Cardiovascular:     Rate and Rhythm: Normal rate and regular rhythm.  Pulmonary:     Effort: Pulmonary effort is normal.     Breath sounds: Examination of the right-upper field reveals rhonchi. Examination of the left-upper field reveals rhonchi. Examination of the left-lower field reveals rhonchi. Rhonchi present. No wheezing.     Comments: Persistent dry and congested cough during visit Musculoskeletal:     Cervical back: Neck supple.  Skin:    General: Skin is warm and dry.           Assessment & Plan:  Acute cough Assessment & Plan: Suspicious for bacterial involvement at this point.  Given duration of symptoms, coupled with presentation, will treat. Penicillin allergy.  Start Doxycycline  antibiotic for the infection. Take 1 tablet by mouth twice daily for 7 days. Rx for Hycodan provided to take every 8 hours as needed.  Drowsiness precautions provided.  Follow-up as needed.  Orders: -     Doxycycline  Hyclate; Take 1 tablet (100 mg total) by mouth 2 (two) times daily.  Dispense: 14 tablet; Refill: 0 -     HYDROcodone  Bit-Homatrop MBr; Take 5 mLs by mouth every 8 (eight) hours as needed for up to 5 days for cough.  Dispense: 75 mL; Refill: 0        Comer MARLA Gaskins, NP

## 2024-01-11 NOTE — Patient Instructions (Signed)
 Start Doxycycline  antibiotic for the infection. Take 1 tablet by mouth twice daily for 7 days.  Start Hycodan cough suppressant. Take 5 ml every 8 hours as needed for cough and rest. Caution this medication contains codeine which may cause drowsiness.   It was a pleasure to see you today!

## 2024-01-11 NOTE — Assessment & Plan Note (Signed)
 Suspicious for bacterial involvement at this point.  Given duration of symptoms, coupled with presentation, will treat. Penicillin allergy.  Start Doxycycline  antibiotic for the infection. Take 1 tablet by mouth twice daily for 7 days. Rx for Hycodan provided to take every 8 hours as needed.  Drowsiness precautions provided.  Follow-up as needed.

## 2024-01-16 ENCOUNTER — Other Ambulatory Visit: Payer: Self-pay | Admitting: Primary Care

## 2024-01-16 DIAGNOSIS — Z1231 Encounter for screening mammogram for malignant neoplasm of breast: Secondary | ICD-10-CM

## 2024-01-17 ENCOUNTER — Encounter: Payer: Self-pay | Admitting: Primary Care

## 2024-01-17 ENCOUNTER — Ambulatory Visit (INDEPENDENT_AMBULATORY_CARE_PROVIDER_SITE_OTHER): Admitting: Primary Care

## 2024-01-17 VITALS — BP 138/76 | HR 68 | Temp 97.4°F | Ht 62.5 in | Wt 128.0 lb

## 2024-01-17 DIAGNOSIS — Z0001 Encounter for general adult medical examination with abnormal findings: Secondary | ICD-10-CM | POA: Diagnosis not present

## 2024-01-17 DIAGNOSIS — G4700 Insomnia, unspecified: Secondary | ICD-10-CM

## 2024-01-17 DIAGNOSIS — R051 Acute cough: Secondary | ICD-10-CM | POA: Diagnosis not present

## 2024-01-17 DIAGNOSIS — E785 Hyperlipidemia, unspecified: Secondary | ICD-10-CM | POA: Diagnosis not present

## 2024-01-17 LAB — COMPREHENSIVE METABOLIC PANEL WITH GFR
ALT: 21 U/L (ref 0–35)
AST: 19 U/L (ref 0–37)
Albumin: 4.5 g/dL (ref 3.5–5.2)
Alkaline Phosphatase: 65 U/L (ref 39–117)
BUN: 9 mg/dL (ref 6–23)
CO2: 30 meq/L (ref 19–32)
Calcium: 9.4 mg/dL (ref 8.4–10.5)
Chloride: 99 meq/L (ref 96–112)
Creatinine, Ser: 0.64 mg/dL (ref 0.40–1.20)
GFR: 103.6 mL/min (ref >60.00)
Glucose, Bld: 90 mg/dL (ref 70–99)
Potassium: 4.4 meq/L (ref 3.5–5.1)
Sodium: 137 meq/L (ref 135–145)
Total Bilirubin: 0.6 mg/dL (ref 0.2–1.2)
Total Protein: 6.8 g/dL (ref 6.0–8.3)

## 2024-01-17 LAB — LIPID PANEL
Cholesterol: 253 mg/dL — ABNORMAL HIGH (ref 0–200)
HDL: 71.1 mg/dL (ref >39.00)
LDL Cholesterol: 159 mg/dL — ABNORMAL HIGH (ref 0–99)
NonHDL: 181.66
Total CHOL/HDL Ratio: 4
Triglycerides: 113 mg/dL (ref 0.0–149.0)
VLDL: 22.6 mg/dL (ref 0.0–40.0)

## 2024-01-17 MED ORDER — HYDROCODONE BIT-HOMATROP MBR 5-1.5 MG/5ML PO SOLN: 5.0000 mL | Freq: Three times a day (TID) | ORAL | 0 refills | Status: AC | PRN

## 2024-01-17 MED ORDER — AZITHROMYCIN 250 MG PO TABS: ORAL_TABLET | ORAL | 0 refills | Status: AC

## 2024-01-17 NOTE — Assessment & Plan Note (Signed)
 Immunizations UTD. Pap smear overdue, she declines today. Mammogram scheduled Colonoscopy due, she declines colonoscopy and Cologuard  Discussed the importance of a healthy diet and regular exercise in order for weight loss, and to reduce the risk of further co-morbidity.  Exam stable. Labs pending.  Follow up in 1 year for repeat physical.

## 2024-01-17 NOTE — Assessment & Plan Note (Signed)
 Improving but not at goal.  Given inability to finish current prescription of antibiotics, will discontinue and resume with new option.  Start doxycycline . Start Azithromycin  antibiotics for infection. Take 2 tablets by mouth today, then 1 tablet daily for 4 additional days. She has a penicillin allergy.  Will refill her Hycodan cough syrup. We also discussed to add Pepcid  at night.

## 2024-01-17 NOTE — Assessment & Plan Note (Signed)
 No concerns today. Continue to monitor.

## 2024-01-17 NOTE — Progress Notes (Signed)
 Subjective:    Patient ID: Sarah Chambers, female    DOB: 11-19-1975, 48 y.o.   MRN: [id number]  HPI  Katelen D Fidalgo is a very pleasant 48 y.o. female who presents today for complete physical and follow up of chronic conditions.  She was last evaluated on 01/11/2024 for 12-day history of URI symptoms.  Exam was suspicious for bacterial involvement so she was prescribed doxycycline  100 mg tablets twice daily.  Since her last visit she's feeling better and her cough is better, but she continues to experience a congested cough. She's experienced nausea with the last few doses of Doxycycline . Her cough is worse at night.   Immunizations: -Tetanus: Completed in 2023 per employer  Diet: Fair diet.  Exercise: No regular exercise.  Eye exam: Completes annually  Dental exam: Completes semi-annually    Pap Smear: Completed in 2021, declines today  Mammogram: Completed in 2024, scheduled for August 2025  Colonoscopy: Never completed, declined last year. Declines this year.       Review of Systems  Constitutional:  Negative for chills, fever and unexpected weight change.  HENT:  Positive for congestion. Negative for rhinorrhea.   Respiratory:  Positive for cough. Negative for shortness of breath.   Cardiovascular:  Negative for chest pain.  Gastrointestinal:  Negative for constipation and diarrhea.  Genitourinary:  Negative for difficulty urinating.  Musculoskeletal:  Negative for arthralgias and myalgias.  Skin:  Negative for rash.  Allergic/Immunologic: Negative for environmental allergies.  Neurological:  Negative for dizziness and headaches.  Psychiatric/Behavioral:  The patient is not nervous/anxious.          Past Medical History:  Diagnosis Date   History of kidney stones    Pain of left upper extremity 11/21/2023   Seasonal allergies     Social History   Socioeconomic History   Marital status: Married    Spouse name: Not on file   Number of children: Not on  file   Years of education: Not on file   Highest education level: Not on file  Occupational History   Not on file  Tobacco Use   Smoking status: Never   Smokeless tobacco: Never  Substance and Sexual Activity   Alcohol use: No    Alcohol/week: 0.0 standard drinks of alcohol    Comment: socailly    Drug use: No   Sexual activity: Yes    Partners: Male  Other Topics Concern   Not on file  Social History Narrative   ** Merged History Encounter **       Married. 3 children. 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 Enjoys exercising, reading    Social Drivers of Health   Financial Resource Strain: Not on file  Food Insecurity: Not on file  Transportation Needs: Not on file  Physical Activity: Not on file  Stress: Not on file  Social Connections: Not on file  Intimate Partner Violence: Not on file    Past Surgical History:  Procedure Laterality Date   CESAREAN SECTION     1998, [id number]   CESAREAN SECTION  1998, 2000, 2007   TUBAL LIGATION      History reviewed. No pertinent family history.  Allergies  Allergen Reactions   Amoxicillin Hives    Current Outpatient Medications on File Prior to Visit  Medication Sig Dispense Refill   cetirizine (ZYRTEC) 10 MG tablet Take 10 mg by mouth daily.     fluticasone  (FLONASE )  50 MCG/ACT nasal spray Place 2 sprays into both nostrils daily. 16 g 6   albuterol  (VENTOLIN  HFA) 108 (90 Base) MCG/ACT inhaler Inhale 1-2 puffs into the lungs every 6 (six) hours as needed. (Patient not taking: Reported on 01/17/2024) 18 g 0   No current facility-administered medications on file prior to visit.    BP 138/76   Pulse 68   Temp (!) 97.4 F (36.3 C) (Temporal)   Ht 5' 2.5 (1.588 m)   Wt 128 lb (58.1 kg)   LMP 12/31/2023   SpO2 99%   BMI 23.04 kg/m  Objective:   Physical Exam Constitutional:      Appearance: She is not ill-appearing.  HENT:     Right Ear: Tympanic membrane  and ear canal normal.     Left Ear: Tympanic membrane and ear canal normal.  Eyes:     Pupils: Pupils are equal, round, and reactive to light.  Cardiovascular:     Rate and Rhythm: Normal rate and regular rhythm.  Pulmonary:     Effort: Pulmonary effort is normal.     Breath sounds: Examination of the right-upper field reveals rhonchi. Examination of the left-upper field reveals rhonchi. Examination of the right-lower field reveals rhonchi. Examination of the left-lower field reveals rhonchi. Rhonchi present. No wheezing.     Comments: Persistent congested cough throughout visit. Lung sounds upon exam have improved compared to last visit. Abdominal:     General: Bowel sounds are normal.     Palpations: Abdomen is soft.     Tenderness: There is no abdominal tenderness.  Musculoskeletal:        General: Normal range of motion.     Cervical back: Neck supple.  Skin:    General: Skin is warm and dry.  Neurological:     Mental Status: She is alert and oriented to person, place, and time.     Cranial Nerves: No cranial nerve deficit.     Deep Tendon Reflexes:     Reflex Scores:      Patellar reflexes are 2+ on the right side and 2+ on the left side. Psychiatric:        Mood and Affect: Mood normal.           Assessment & Plan:  Encounter for annual general medical examination with abnormal findings in adult Assessment & Plan: Immunizations UTD. Pap smear overdue, she declines today. Mammogram scheduled Colonoscopy due, she declines colonoscopy and Cologuard  Discussed the importance of a healthy diet and regular exercise in order for weight loss, and to reduce the risk of further co-morbidity.  Exam stable. Labs pending.  Follow up in 1 year for repeat physical.    Acute cough Assessment & Plan: Improving but not at goal.  Given inability to finish current prescription of antibiotics, will discontinue and resume with new option.  Start doxycycline . Start  Azithromycin  antibiotics for infection. Take 2 tablets by mouth today, then 1 tablet daily for 4 additional days. She has a penicillin allergy.  Will refill her Hycodan cough syrup. We also discussed to add Pepcid  at night.  Orders: -     Azithromycin ; Take 2 tablets by mouth today, then 1 tablet daily for 4 additional days.  Dispense: 6 tablet; Refill: 0 -     HYDROcodone  Bit-Homatrop MBr; Take 5 mLs by mouth every 8 (eight) hours as needed for up to 5 days for cough.  Dispense: 75 mL; Refill: 0  Insomnia, unspecified type Assessment & Plan: No  concerns today. Continue to monitor.   Hyperlipidemia, unspecified hyperlipidemia type Assessment & Plan: Mild.  Repeat lipid panel pending.  Orders: -     Comprehensive metabolic panel with GFR -     Lipid panel        Comer MARLA Gaskins, NP

## 2024-01-17 NOTE — Assessment & Plan Note (Signed)
Mild. Repeat lipid panel pending.

## 2024-01-17 NOTE — Patient Instructions (Signed)
 Stop taking doxycycline  antibiotics  Start Azithromycin  antibiotics for infection. Take 2 tablets by mouth today, then 1 tablet daily for 4 additional days.  Stop by the lab prior to leaving today. I will notify you of your results once received.   It was a pleasure to see you today!

## 2024-01-18 ENCOUNTER — Ambulatory Visit: Payer: Self-pay | Admitting: Primary Care

## 2024-01-30 ENCOUNTER — Encounter

## 2024-01-30 DIAGNOSIS — Z1231 Encounter for screening mammogram for malignant neoplasm of breast: Secondary | ICD-10-CM

## 2024-06-09 ENCOUNTER — Ambulatory Visit (INDEPENDENT_AMBULATORY_CARE_PROVIDER_SITE_OTHER)

## 2024-06-09 ENCOUNTER — Ambulatory Visit: Admitting: Podiatry

## 2024-06-09 ENCOUNTER — Encounter: Payer: Self-pay | Admitting: Podiatry

## 2024-06-09 VITALS — Ht 62.5 in | Wt 128.0 lb

## 2024-06-09 DIAGNOSIS — M79672 Pain in left foot: Secondary | ICD-10-CM | POA: Diagnosis not present

## 2024-06-09 DIAGNOSIS — M722 Plantar fascial fibromatosis: Secondary | ICD-10-CM | POA: Diagnosis not present

## 2024-06-09 MED ORDER — MELOXICAM 15 MG PO TABS: 15.0000 mg | ORAL_TABLET | Freq: Every day | ORAL | 3 refills | Status: AC

## 2024-06-09 NOTE — Patient Instructions (Addendum)
 Check with your insurance if they cover custom molded foot orthotics. The codes they will want are: L3020 (procedure code for the orthotics) and M72.2 for plantar fasciitis   VISIT SUMMARY: You came in today to discuss the progressive pain in your left heel that has been worsening over the past year. The pain has been severe and has affected your daily activities, especially when standing or walking on hard surfaces at work.  YOUR PLAN: -PLANTAR FASCIITIS OF LEFT FOOT: Plantar fasciitis is an overuse injury that causes pain and inflammation in the thick band of tissue that runs across the bottom of your foot and connects your heel bone to your toes. We confirmed this diagnosis through a physical exam and X-rays. You have been prescribed meloxicam  to take once daily for 30 days to help reduce inflammation and pain. You should perform the home physical therapy exercises we provided twice daily and use ice packs for relief. We discussed the potential benefits of custom orthotics, and you should check with your insurance about coverage. Continue trying different supportive footwear brands. We will follow up in six weeks to see how you are doing. If your pain becomes severe again, contact us  sooner to discuss the possibility of a steroid injection.  INSTRUCTIONS: Follow up in six weeks to reassess your symptoms and response to treatment. Contact the clinic sooner if your pain returns to severe levels for consideration of a steroid injection.                      Contains text generated by [employer].           Plantar Fasciitis (Heel Spur Syndrome) with Rehab The plantar fascia is a fibrous, ligament-like, soft-tissue structure that spans the bottom of the foot. Plantar fasciitis is a condition that causes pain in the foot due to inflammation of the tissue. SYMPTOMS  Pain and tenderness on the underneath side of the foot. Pain that worsens with standing or walking. CAUSES   Plantar fasciitis is caused by irritation and injury to the plantar fascia on the underneath side of the foot. Common mechanisms of injury include: Direct trauma to bottom of the foot. Damage to a small nerve that runs under the foot where the main fascia attaches to the heel bone. Stress placed on the plantar fascia due to bone spurs. RISK INCREASES WITH:  Activities that place stress on the plantar fascia (running, jumping, pivoting, or cutting). Poor strength and flexibility. Improperly fitted shoes. Tight calf muscles. Flat feet. Failure to warm-up properly before activity. Obesity. PREVENTION Warm up and stretch properly before activity. Allow for adequate recovery between workouts. Maintain physical fitness: Strength, flexibility, and endurance. Cardiovascular fitness. Maintain a health body weight. Avoid stress on the plantar fascia. Wear properly fitted shoes, including arch supports for individuals who have flat feet.  PROGNOSIS  If treated properly, then the symptoms of plantar fasciitis usually resolve without surgery. However, occasionally surgery is necessary.  RELATED COMPLICATIONS  Recurrent symptoms that may result in a chronic condition. Problems of the lower back that are caused by compensating for the injury, such as limping. Pain or weakness of the foot during push-off following surgery. Chronic inflammation, scarring, and partial or complete fascia tear, occurring more often from repeated injections.  TREATMENT  Treatment initially involves the use of ice and medication to help reduce pain and inflammation. The use of strengthening and stretching exercises may help reduce pain with activity, especially stretches of the Achilles tendon. These  exercises may be performed at home or with a therapist. Your caregiver may recommend that you use heel cups of arch supports to help reduce stress on the plantar fascia. Occasionally, corticosteroid injections are given to  reduce inflammation. If symptoms persist for greater than 6 months despite non-surgical (conservative), then surgery may be recommended.   MEDICATION  If pain medication is necessary, then nonsteroidal anti-inflammatory medications, such as aspirin and ibuprofen, or other minor pain relievers, such as acetaminophen, are often recommended. Do not take pain medication within 7 days before surgery. Prescription pain relievers may be given if deemed necessary by your caregiver. Use only as directed and only as much as you need. Corticosteroid injections may be given by your caregiver. These injections should be reserved for the most serious cases, because they may only be given a certain number of times.  HEAT AND COLD Cold treatment (icing) relieves pain and reduces inflammation. Cold treatment should be applied for 10 to 15 minutes every 2 to 3 hours for inflammation and pain and immediately after any activity that aggravates your symptoms. Use ice packs or massage the area with a piece of ice (ice massage). Heat treatment may be used prior to performing the stretching and strengthening activities prescribed by your caregiver, physical therapist, or athletic trainer. Use a heat pack or soak the injury in warm water.  SEEK IMMEDIATE MEDICAL CARE IF: Treatment seems to offer no benefit, or the condition worsens. Any medications produce adverse side effects.  EXERCISES- RANGE OF MOTION (ROM) AND STRETCHING EXERCISES - Plantar Fasciitis (Heel Spur Syndrome) These exercises may help you when beginning to rehabilitate your injury. Your symptoms may resolve with or without further involvement from your physician, physical therapist or athletic trainer. While completing these exercises, remember:  Restoring tissue flexibility helps normal motion to return to the joints. This allows healthier, less painful movement and activity. An effective stretch should be held for at least 30 seconds. A stretch should  never be painful. You should only feel a gentle lengthening or release in the stretched tissue.  RANGE OF MOTION - Toe Extension, Flexion Sit with your right / left leg crossed over your opposite knee. Grasp your toes and gently pull them back toward the top of your foot. You should feel a stretch on the bottom of your toes and/or foot. Hold this stretch for 10 seconds. Now, gently pull your toes toward the bottom of your foot. You should feel a stretch on the top of your toes and or foot. Hold this stretch for 10 seconds. Repeat  times. Complete this stretch 3 times per day.   RANGE OF MOTION - Ankle Dorsiflexion, Active Assisted Remove shoes and sit on a chair that is preferably not on a carpeted surface. Place right / left foot under knee. Extend your opposite leg for support. Keeping your heel down, slide your right / left foot back toward the chair until you feel a stretch at your ankle or calf. If you do not feel a stretch, slide your bottom forward to the edge of the chair, while still keeping your heel down. Hold this stretch for 10 seconds. Repeat 3 times. Complete this stretch 2 times per day.   STRETCH  Gastroc, Standing Place hands on wall. Extend right / left leg, keeping the front knee somewhat bent. Slightly point your toes inward on your back foot. Keeping your right / left heel on the floor and your knee straight, shift your weight toward the wall, not allowing  your back to arch. You should feel a gentle stretch in the right / left calf. Hold this position for 10 seconds. Repeat 3 times. Complete this stretch 2 times per day.  STRETCH  Soleus, Standing Place hands on wall. Extend right / left leg, keeping the other knee somewhat bent. Slightly point your toes inward on your back foot. Keep your right / left heel on the floor, bend your back knee, and slightly shift your weight over the back leg so that you feel a gentle stretch deep in your back calf. Hold this position  for 10 seconds. Repeat 3 times. Complete this stretch 2 times per day.  STRETCH  Gastrocsoleus, Standing  Note: This exercise can place a lot of stress on your foot and ankle. Please complete this exercise only if specifically instructed by your caregiver.  Place the ball of your right / left foot on a step, keeping your other foot firmly on the same step. Hold on to the wall or a rail for balance. Slowly lift your other foot, allowing your body weight to press your heel down over the edge of the step. You should feel a stretch in your right / left calf. Hold this position for 10 seconds. Repeat this exercise with a slight bend in your right / left knee. Repeat 3 times. Complete this stretch 2 times per day.   STRENGTHENING EXERCISES - Plantar Fasciitis (Heel Spur Syndrome)  These exercises may help you when beginning to rehabilitate your injury. They may resolve your symptoms with or without further involvement from your physician, physical therapist or athletic trainer. While completing these exercises, remember:  Muscles can gain both the endurance and the strength needed for everyday activities through controlled exercises. Complete these exercises as instructed by your physician, physical therapist or athletic trainer. Progress the resistance and repetitions only as guided.  STRENGTH - Towel Curls Sit in a chair positioned on a non-carpeted surface. Place your foot on a towel, keeping your heel on the floor. Pull the towel toward your heel by only curling your toes. Keep your heel on the floor. Repeat 3 times. Complete this exercise 2 times per day.  STRENGTH - Ankle Inversion Secure one end of a rubber exercise band/tubing to a fixed object (table, pole). Loop the other end around your foot just before your toes. Place your fists between your knees. This will focus your strengthening at your ankle. Slowly, pull your big toe up and in, making sure the band/tubing is positioned to  resist the entire motion. Hold this position for 10 seconds. Have your muscles resist the band/tubing as it slowly pulls your foot back to the starting position. Repeat 3 times. Complete this exercises 2 times per day.  Document Released: 05/22/2005 Document Revised: 08/14/2011 Document Reviewed: 09/03/2008 [facility] Patient Information 2014 [place], [place].

## 2024-06-09 NOTE — Progress Notes (Signed)
 "  Subjective:  Patient ID: Sarah Chambers, female    DOB: [birth year],  MRN: [id number]  Chief Complaint  Patient presents with   Plantar Fasciitis    Rm 7 Patient is her pain of the left foot, possible plantar fasciitis. Pt states pain in left heel, has been present for one year. Pt has tried stretching exercises and cold therapy with minor relief.    Discussed the use of AI scribe software for clinical note transcription with the patient, who gave verbal consent to proceed.  History of Present Illness Sarah Chambers is a 49 year old female who presents for evaluation of progressive left plantar heel pain.  For approximately one year, she has experienced progressively worsening pain localized to the plantar medial and central heel of the left foot, radiating through the arch. The pain is severe, described as horrible at its worst, and associated with significant inflammation and tenderness, limiting her ability to ambulate independently and perform basic activities such as toileting. The pain is aggravated by prolonged standing and walking on hard surfaces at work.  She denies any preceding injury or trauma. The pain has fluctuated, with some improvement noted after scheduling this appointment, but remains persistent. She alternates footwear and notes partial relief with [employer] brand shoes. She spends all day on her feet at work, primarily on concrete surfaces, and wears safety shoes as required. She has not previously used insoles or orthotics.  She has taken ibuprofen, typically three tablets at a time as needed, during periods of severe pain. She has not used other anti-inflammatory medications, physical therapy, custom orthotics, or other interventions. She denies any history of injury initiating symptoms. No recent changes in work hours or activities.      Objective:    Physical Exam VASCULAR: DP and PT pulse palpable. Foot is warm and well-perfused. Capillary fill time is  brisk. DERMATOLOGIC: Normal skin turgor, texture, and temperature. No open lesions, rashes, or ulcerations. NEUROLOGIC: Normal sensation to light touch and pressure. No paresthesias on examination. ORTHOPEDIC: Moderate pain on palpation of plantar medial and central heel. Pes cavus foot type. No pain with lateral compression, no stress fracture. No pain in posterior Achilles. Minimal gastrocnemius equinus. Smooth pain-free range of motion of all examined joints. No ecchymosis or bruising. No gross deformity.   No images are attached to the encounter.    Results Radiology Left foot radiographs (06/09/2024): Pes cavus, absence of plantar heel spur, no stress fracture, no fracture, findings consistent with plantar fasciitis (Independently interpreted)   Assessment:   1. Plantar fasciitis of left foot      Plan:  Patient was evaluated and treated and all questions answered.  Assessment and Plan Assessment & Plan Plantar fasciitis of left foot Chronic plantar fasciitis of the left foot with moderate pain and tenderness at the plantar medial and central heel, consistent with overuse injury. Physical examination and radiographs confirmed the diagnosis. Pes cavus foot type may contribute to persistent symptoms. No evidence of stress fracture or significant heel spur. - Prescribed meloxicam  once daily for 30 days. - Provided home physical therapy exercise sheet to be performed twice daily. - Recommended use of ice packs for symptomatic relief. - Advised her to check insurance coverage for custom orthotics and provided billing codes; discussed potential for custom orthotics to provide arch support and offload heel pressure. She will follow up regarding insurance coverage and possible orthotic fitting at next visit. - Discussed supportive footwear options and advised continued trial of  different brands as tolerated. - Scheduled follow-up in six weeks to reassess symptoms and response to  treatment. - Advised her to contact clinic sooner if pain returns to severe levels for consideration of steroid injection; discussed steroid injection as an option if pain persists or worsens.      Return in about 6 weeks (around 07/21/2024) for recheck plantar fasciitis.   "

## 2024-06-10 ENCOUNTER — Encounter: Admitting: Obstetrics and Gynecology

## 2024-07-21 ENCOUNTER — Ambulatory Visit: Admitting: Podiatry
